# Patient Record
Sex: Male | Born: 1980 | Race: White | Hispanic: No | Marital: Married | Smoking: Never smoker
Health system: Southern US, Community
[De-identification: ages and names within clinical notes are randomized; demographics above are authoritative.]

---
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
## 2003-08-21 ENCOUNTER — Emergency Department (HOSPITAL_COMMUNITY): Admission: EM | Admit: 2003-08-21 | Discharge: 2003-08-21 | Payer: Self-pay | Admitting: Emergency Medicine

## 2006-01-05 ENCOUNTER — Emergency Department (HOSPITAL_COMMUNITY): Admission: EM | Admit: 2006-01-05 | Discharge: 2006-01-05 | Payer: Self-pay | Admitting: Emergency Medicine

## 2006-01-06 ENCOUNTER — Ambulatory Visit (HOSPITAL_COMMUNITY): Admission: RE | Admit: 2006-01-06 | Discharge: 2006-01-06 | Payer: Self-pay | Admitting: Orthopedic Surgery

## 2015-02-21 DIAGNOSIS — Z8249 Family history of ischemic heart disease and other diseases of the circulatory system: Secondary | ICD-10-CM | POA: Insufficient documentation

## 2015-02-21 HISTORY — DX: Family history of ischemic heart disease and other diseases of the circulatory system: Z82.49

## 2019-02-17 ENCOUNTER — Encounter: Payer: Self-pay | Admitting: *Deleted

## 2019-02-17 ENCOUNTER — Ambulatory Visit (INDEPENDENT_AMBULATORY_CARE_PROVIDER_SITE_OTHER): Payer: 59 | Admitting: Cardiology

## 2019-02-17 ENCOUNTER — Ambulatory Visit (INDEPENDENT_AMBULATORY_CARE_PROVIDER_SITE_OTHER): Payer: 59

## 2019-02-17 ENCOUNTER — Other Ambulatory Visit: Payer: Self-pay

## 2019-02-17 VITALS — BP 130/98 | HR 82 | Ht 69.0 in | Wt 210.0 lb

## 2019-02-17 DIAGNOSIS — R002 Palpitations: Secondary | ICD-10-CM

## 2019-02-17 DIAGNOSIS — Z01812 Encounter for preprocedural laboratory examination: Secondary | ICD-10-CM

## 2019-02-17 DIAGNOSIS — R0602 Shortness of breath: Secondary | ICD-10-CM

## 2019-02-17 DIAGNOSIS — I493 Ventricular premature depolarization: Secondary | ICD-10-CM

## 2019-02-17 DIAGNOSIS — R079 Chest pain, unspecified: Secondary | ICD-10-CM | POA: Insufficient documentation

## 2019-02-17 HISTORY — DX: Ventricular premature depolarization: I49.3

## 2019-02-17 HISTORY — DX: Encounter for preprocedural laboratory examination: Z01.812

## 2019-02-17 HISTORY — DX: Chest pain, unspecified: R07.9

## 2019-02-17 HISTORY — DX: Shortness of breath: R06.02

## 2019-02-17 HISTORY — DX: Palpitations: R00.2

## 2019-02-17 MED ORDER — METOPROLOL SUCCINATE ER 25 MG PO TB24: 12.5000 mg | ORAL_TABLET | Freq: Every day | ORAL | 1 refills | Status: DC

## 2019-02-17 MED ORDER — NITROGLYCERIN 0.4 MG SL SUBL: 0.4000 mg | SUBLINGUAL_TABLET | SUBLINGUAL | 3 refills | Status: DC | PRN

## 2019-02-17 NOTE — Progress Notes (Signed)
Cardiology Office Note:    Date:  02/17/2019   ID:  Anthony Rodgers, DOB 1980-12-02, MRN [id number]  PCP:  Hurshel Party, NP  Cardiologist:  No primary care provider on file.  Electrophysiologist:  None   Referring MD: No ref. provider found   Chief Complaint  Patient presents with  . Shortness of Breath  . Chest Pain  . Tachycardia   History of Present Illness:    Anthony Rodgers is a 38 y.o. male with a hx signet.of hyperlipidemia and significant family history of coronary artery disease, presents to be evaluated for a chest pain and palpitations.  Patient tells me that over the last several months he has been experiencing increasing palpitations which she described as a rapid onset of fast heartbeat that lasts for sometimes minutes to hours prior to resolution.  Associated with this is shortness of breath.  He states what is of the more problems over the last several weeks is the fact that he has been experiencing substernal mid chest pain.  He notes that this is sometimes a dull sensation that is associated with tightness around his chest.  He denies any radiation but does admit shortness of breath with these episodes.  He notes that he does not matter what he is doing recently he has been experiencing this while sitting.  He was seen in the ED at [facility] due to rapid heartbeat.  At that time he was told that he was also experiencing PVCs.  His work-up was reported to him to be negative and he was asked to follow-up with cardiology.  Of note he was seen at the [facility] ED on 10/28/2022 chest tightness and dyspnea.  On that day he EKG did show frequent PVCs.  He was told to his cardiac work-up was negative  Past Medical History:  Diagnosis Date  . Family history of cardiomyopathy 02/21/2015    Past Surgical History:  Procedure Laterality Date  . HAND SURGERY Right 2001   Football injury    Current Medications: No outpatient medications have  been marked as taking for the 02/17/19 encounter (Office Visit) with Thomasene Ripple, DO.     Allergies:   Patient has no known allergies.   Social History   Socioeconomic History  . Marital status: Married    Spouse name: Not on file  . Number of children: Not on file  . Years of education: Not on file  . Highest education level: Not on file  Occupational History  . Not on file  Tobacco Use  . Smoking status: Never Smoker  . Smokeless tobacco: Never Used  Substance and Sexual Activity  . Alcohol use: Not Currently  . Drug use: Never  . Sexual activity: Not on file  Other Topics Concern  . Not on file  Social History Narrative  . Not on file   Social Determinants of Health   Financial Resource Strain:   . Difficulty of Paying Living Expenses: Not on file  Food Insecurity:   . Worried About Programme researcher, broadcasting/film/video in the Last Year: Not on file  . Ran Out of Food in the Last Year: Not on file  Transportation Needs:   . Lack of Transportation (Medical): Not on file  . Lack of Transportation (Non-Medical): Not on file  Physical Activity:   . Days of Exercise per Week: Not on file  . Minutes of Exercise per Session: Not on file  Stress:   . Feeling of  Stress : Not on file  Social Connections:   . Frequency of Communication with Friends and Family: Not on file  . Frequency of Social Gatherings with Friends and Family: Not on file  . Attends Religious Services: Not on file  . Active Member of Clubs or Organizations: Not on file  . Attends Archivist Meetings: Not on file  . Marital Status: Not on file     Family History: The patient's family history includes Arrhythmia in his brother and father; Diabetes in his mother; Heart attack in his father; Heart disease in his father and paternal grandmother; Heart failure in his father; Hyperlipidemia in his father and mother; Hypertension in his father and mother.  ROS:   Review of Systems  Constitution: Negative for  decreased appetite, fever and weight gain.  HENT: Negative for congestion, ear discharge, hoarse voice and sore throat.   Eyes: Negative for discharge, redness, vision loss in right eye and visual halos.  Cardiovascular: Negative for chest pain, dyspnea on exertion, leg swelling, orthopnea and palpitations.  Respiratory: Negative for cough, hemoptysis, shortness of breath and snoring.   Endocrine: Negative for heat intolerance and polyphagia.  Hematologic/Lymphatic: Negative for bleeding problem. Does not bruise/bleed easily.  Skin: Negative for flushing, nail changes, rash and suspicious lesions.  Musculoskeletal: Negative for arthritis, joint pain, muscle cramps, myalgias, neck pain and stiffness.  Gastrointestinal: Negative for abdominal pain, bowel incontinence, diarrhea and excessive appetite.  Genitourinary: Negative for decreased libido, genital sores and incomplete emptying.  Neurological: Negative for brief paralysis, focal weakness, headaches and loss of balance.  Psychiatric/Behavioral: Negative for altered mental status, depression and suicidal ideas.  Allergic/Immunologic: Negative for HIV exposure and persistent infections.    EKGs/Labs/Other Studies Reviewed:    The following studies were reviewed today:   EKG:  The ekg ordered today demonstrates Sinus rhythm, heart rate 75 bpm with nonspecific ST changes compared to ECG done on February 15, 2019 at that time he was also in sinus rhythm with occasional PVCs.  Recent Labs: No results found for requested labs within last 8760 hours.  Recent Lipid Panel No results found for: CHOL, TRIG, HDL, CHOLHDL, VLDL, LDLCALC, LDLDIRECT  Physical Exam:    VS:  BP (!) 130/98 (BP Location: Left Arm, Patient Position: Sitting, Cuff Size: Normal)   Pulse 82   Ht 5\' 9"  (1.753 m)   Wt 210 lb (95.3 kg)   SpO2 97%   BMI 31.01 kg/m     Wt Readings from Last 3 Encounters:  02/17/19 210 lb (95.3 kg)     GEN: Well nourished, well  developed in no acute distress HEENT: Normal NECK: No JVD; No carotid bruits LYMPHATICS: No lymphadenopathy CARDIAC: S1S2 noted,RRR, no murmurs, rubs, gallops RESPIRATORY:  Clear to auscultation without rales, wheezing or rhonchi  ABDOMEN: Soft, non-tender, non-distended, +bowel sounds, no guarding. EXTREMITIES: No edema, No cyanosis, no clubbing MUSCULOSKELETAL:  No edema; No deformity  SKIN: Warm and dry NEUROLOGIC:  Alert and oriented x 3, non-focal PSYCHIATRIC:  Normal affect, good insight  ASSESSMENT:    1. Pre-procedure lab exam   2. Chest pain, unspecified type   3. Palpitations    PLAN:    1.  I would like to rule out a cardiovascular etiology of this palpitation, therefore at this time I would like to placed a zio patch for 7 days. In additon a transthoracic echocardiogram will be ordered to assess LV/RV function and any structural abnormalities. Once these testing have been performed amd reviewed  further reccomendations will be made. For now, I do reccomend that the patient goes to the nearest ED if  symptoms recur.  2.  His chest pain is concerning given his family history of significant premature coronary artery disease, he also does have hypertension which I discussed with the patient today as well as he has been treated for hyperlipidemia.  Therefore I am going to pursue an ischemic evaluation in the setting of the symptoms.  He does not have any IV contrast dye allergies.  A coronary CTA will be appropriate in this patient.  Educated patient about the testing and he is agreeable to proceed.  Sublingual nitroglycerin prescription was sent, its protocol and 911 protocol explained and the patient vocalized understanding questions were answered to the patient's satisfaction  3.  Hypertension-I am going to start the patient on Toprol XL 12.5 mg daily which will mainly also help with his palpitations and increasing PVCs.  4.  Hyperlipidemia-continue patient on his current Lipitor  20 mg daily.   The patient is in agreement with the above plan. The patient left the office in stable condition.  The patient will follow up in 3 months or sooner if needed   Medication Adjustments/Labs and Tests Ordered: Current medicines are reviewed at length with the patient today.  Concerns regarding medicines are outlined above.  Orders Placed This Encounter  Procedures  . CT CORONARY FRACTIONAL FLOW RESERVE DATA PREP  . CT CORONARY FRACTIONAL FLOW RESERVE FLUID ANALYSIS  . CT CORONARY MORPH W/CTA COR W/SCORE W/CA W/CM &/OR WO/CM  . Basic Metabolic Panel (BMET)  . LONG TERM MONITOR (3-14 DAYS)  . EKG 12-Lead  . ECHOCARDIOGRAM COMPLETE   Meds ordered this encounter  Medications  . metoprolol succinate (TOPROL XL) 25 MG 24 hr tablet    Sig: Take 0.5 tablets (12.5 mg total) by mouth daily.    Dispense:  45 tablet    Refill:  1  . nitroGLYCERIN (NITROSTAT) 0.4 MG SL tablet    Sig: Place 1 tablet (0.4 mg total) under the tongue every 5 (five) minutes as needed.    Dispense:  30 tablet    Refill:  3    Patient Instructions  Medication Instructions:  Your physician has recommended you make the following change in your medication:   START: Toprol XL(metoprolol succinate) 25 mg Take 1/2 tab daily  Nitroglycerin 0.4 mg sublingual (under your tongue) as needed for chest pain. If experiencing chest pain, stop what you are doing and sit down. Take 1 nitroglycerin and wait 5 minutes. If chest pain continues, take another nitroglycerin and wait 5 minutes. If chest pain does not subside, take 1 more nitroglycerin and dial 911. You make take a total of 3 nitroglycerin in a 15 minute time frame. .    If you need a refill on your cardiac medications before your next appointment, please call your pharmacy*  Lab Work: Your physician recommends that you return for lab work in:   3-7 days prior to CT:BMP  If you have labs (blood work) drawn today and your tests are completely normal,  you will receive your results only by: Marland Kitchen. MyChart Message (if you have MyChart) OR. . A paper copy in the mail If you have any lab test that is abnormal or we need to change your treatment, we will call you to review the results.  Testing/Procedures: Your physician has requested that you have an echocardiogram. Echocardiography is a painless test that uses sound waves to create  images of your heart. It provides your doctor with information about the size and shape of your heart and how well your heart's chambers and valves are working. This procedure takes approximately one hour. There are no restrictions for this procedure.  A zio monitor was placed today. It will remain on for 7 days. You will then return monitor and event diary in provided box. It takes 1-2 weeks for report to be downloaded and returned to [place]. We will call you with the results. If monitor falls off or has orange flashing light, please call Zio for further instructions.   Your physician has requested that you have cardiac CT. Cardiac computed tomography (CT) is a painless test that uses an x-ray machine to take clear, detailed pictures of your heart. For further information please visit [email or web address] Please follow instruction sheet as given.  Your cardiac CT will be scheduled at one of the below locations:   [facility] [address] [place], [place] [address] [phone]   If scheduled at [facility], please arrive at the [facility] main entrance of [facility] 30-45 minutes prior to test start time. Proceed to the [facility] Radiology Department (first floor) to check-in and test prep.    Please follow these instructions carefully (unless otherwise directed):    On the Night Before the Test: . Be sure to Drink plenty of water. . Do not consume any caffeinated/decaffeinated beverages or chocolate 12 hours prior to your test. . Do not take any antihistamines 12 hours prior to  your test.  On the Day of the Test: . Drink plenty of water. Do not drink any water within one hour of the test. . Do not eat any food 4 hours prior to the test. . You may take your regular medications prior to the test.  . Take metoprolol (Toprol) two hours prior to test. . FEMALES- please wear underwire-free bra if available                  -If HR is less than 55 BPM- No Beta Blocker(Toprol XL)                -IF HR is greater than 55 BPM and patient is less than or equal to 75 yrs old Toprol XL 12.5 mg x1.               After the Test: . Drink plenty of water. . After receiving IV contrast, you may experience a mild flushed feeling. This is normal. . On occasion, you may experience a mild rash up to 24 hours after the test. This is not dangerous. If this occurs, you can take Benadryl 25 mg and increase your fluid intake. . If you experience trouble breathing, this can be serious. If it is severe call 911 IMMEDIATELY. If it is mild, please call our office.   Once we have confirmed authorization from your insurance company, we will call you to set up a date and time for your test.   For non-scheduling related questions, please contact the cardiac imaging nurse navigator should you have any questions/concerns: Rockwell Alexandria, RN Navigator Cardiac Imaging [facility] [phone] Office   Follow-Up: At [facility], you and your health needs are our priority.  As part of our continuing mission to provide you with exceptional heart care, we have created designated Provider Care Teams.  These Care Teams include your primary Cardiologist (physician) and Advanced Practice Providers (  APPs -  Physician Assistants and Nurse Practitioners) who all work together to provide you with the care you need, when you need it.  Your next appointment:   3 month(s)  The format for your next appointment:   In Person  Provider:   Thomasene Ripple, DO  Other Instructions       Adopting a Healthy Lifestyle.  Know what a healthy weight is for you (roughly BMI <25) and aim to maintain this   Aim for 7+ servings of fruits and vegetables daily   65-80+ fluid ounces of water or unsweet tea for healthy kidneys   Limit to max 1 drink of alcohol per day; avoid smoking/tobacco   Limit animal fats in diet for cholesterol and heart health - choose grass fed whenever available   Avoid highly processed foods, and foods high in saturated/trans fats   Aim for low stress - take time to unwind and care for your mental health   Aim for 150 min of moderate intensity exercise weekly for heart health, and weights twice weekly for bone health   Aim for 7-9 hours of sleep daily   When it comes to diets, agreement about the perfect plan isnt easy to find, even among the experts. Experts at the [facility] of [employer] developed an idea known as the Healthy Eating Plate. Just imagine a plate divided into logical, healthy portions.   The emphasis is on diet quality:   Load up on vegetables and fruits - one-half of your plate: Aim for color and variety, and remember that potatoes dont count.   Go for whole grains - one-quarter of your plate: Whole wheat, barley, wheat berries, quinoa, oats, brown rice, and foods made with them. If you want pasta, go with whole wheat pasta.   Protein power - one-quarter of your plate: Fish, chicken, beans, and nuts are all healthy, versatile protein sources. Limit red meat.   The diet, however, does go beyond the plate, offering a few other suggestions.   Use healthy plant oils, such as olive, canola, soy, corn, sunflower and peanut. Check the labels, and avoid partially hydrogenated oil, which have unhealthy trans fats.   If youre thirsty, drink water. Coffee and tea are good in moderation, but skip sugary drinks and limit milk and dairy products to one or two daily servings.   The type of carbohydrate in the diet is more important  than the amount. Some sources of carbohydrates, such as vegetables, fruits, whole grains, and beans-are healthier than others.   Finally, stay active  Signed, Thomasene Ripple, DO  02/17/2019 10:46 AM    [facility]

## 2019-02-17 NOTE — Patient Instructions (Signed)
Medication Instructions:  Your physician has recommended you make the following change in your medication:   START: Toprol XL(metoprolol succinate) 25 mg Take 1/2 tab daily  Nitroglycerin 0.4 mg sublingual (under your tongue) as needed for chest pain. If experiencing chest pain, stop what you are doing and sit down. Take 1 nitroglycerin and wait 5 minutes. If chest pain continues, take another nitroglycerin and wait 5 minutes. If chest pain does not subside, take 1 more nitroglycerin and dial 911. You make take a total of 3 nitroglycerin in a 15 minute time frame. .    If you need a refill on your cardiac medications before your next appointment, please call your pharmacy*  Lab Work: Your physician recommends that you return for lab work in:   3-7 days prior to CT:BMP  If you have labs (blood work) drawn today and your tests are completely normal, you will receive your results only by: Marland Kitchen MyChart Message (if you have MyChart) OR. . A paper copy in the mail If you have any lab test that is abnormal or we need to change your treatment, we will call you to review the results.  Testing/Procedures: Your physician has requested that you have an echocardiogram. Echocardiography is a painless test that uses sound waves to create images of your heart. It provides your doctor with information about the size and shape of your heart and how well your heart's chambers and valves are working. This procedure takes approximately one hour. There are no restrictions for this procedure.  A zio monitor was placed today. It will remain on for 7 days. You will then return monitor and event diary in provided box. It takes 1-2 weeks for report to be downloaded and returned to [place]. We will call you with the results. If monitor falls off or has orange flashing light, please call Zio for further instructions.   Your physician has requested that you have cardiac CT. Cardiac computed tomography (CT) is a painless test  that uses an x-ray machine to take clear, detailed pictures of your heart. For further information please visit [email or web address] Please follow instruction sheet as given.  Your cardiac CT will be scheduled at one of the below locations:   [facility] [address] [place], [place] [address] [phone]   If scheduled at [facility], please arrive at the [facility] main entrance of [facility] 30-45 minutes prior to test start time. Proceed to the [facility] Radiology Department (first floor) to check-in and test prep.    Please follow these instructions carefully (unless otherwise directed):    On the Night Before the Test: . Be sure to Drink plenty of water. . Do not consume any caffeinated/decaffeinated beverages or chocolate 12 hours prior to your test. . Do not take any antihistamines 12 hours prior to your test.  On the Day of the Test: . Drink plenty of water. Do not drink any water within one hour of the test. . Do not eat any food 4 hours prior to the test. . You may take your regular medications prior to the test.  . Take metoprolol (Toprol) two hours prior to test. . FEMALES- please wear underwire-free bra if available                  -If HR is less than 55 BPM- No Beta Blocker(Toprol XL)                -IF HR is  greater than 55 BPM and patient is less than or equal to 30 yrs old Toprol XL 12.5 mg x1.               After the Test: . Drink plenty of water. . After receiving IV contrast, you may experience a mild flushed feeling. This is normal. . On occasion, you may experience a mild rash up to 24 hours after the test. This is not dangerous. If this occurs, you can take Benadryl 25 mg and increase your fluid intake. . If you experience trouble breathing, this can be serious. If it is severe call 911 IMMEDIATELY. If it is mild, please call our office.   Once we have confirmed authorization from your insurance company, we will  call you to set up a date and time for your test.   For non-scheduling related questions, please contact the cardiac imaging nurse navigator should you have any questions/concerns: Marchia Bond, RN Navigator Cardiac Imaging [facility] [phone] Office   Follow-Up: At [facility], you and your health needs are our priority.  As part of our continuing mission to provide you with exceptional heart care, we have created designated Provider Care Teams.  These Care Teams include your primary Cardiologist (physician) and Advanced Practice Providers (APPs -  Physician Assistants and Nurse Practitioners) who all work together to provide you with the care you need, when you need it.  Your next appointment:   3 month(s)  The format for your next appointment:   In Person  Provider:   Berniece Salines, DO  Other Instructions

## 2019-02-18 ENCOUNTER — Ambulatory Visit (HOSPITAL_BASED_OUTPATIENT_CLINIC_OR_DEPARTMENT_OTHER)
Admission: RE | Admit: 2019-02-18 | Discharge: 2019-02-18 | Disposition: A | Discharge status: Home / Self Care | Payer: 59 | Source: Ambulatory Visit | Attending: Cardiology | Admitting: Cardiology

## 2019-02-18 DIAGNOSIS — R079 Chest pain, unspecified: Secondary | ICD-10-CM | POA: Insufficient documentation

## 2019-02-18 NOTE — Progress Notes (Signed)
  Echocardiogram 2D Echocardiogram has been performed.    Cardell Peach 02/18/2019, 11:22 AM

## 2019-02-21 ENCOUNTER — Telehealth: Payer: Self-pay | Admitting: *Deleted

## 2019-02-21 NOTE — Telephone Encounter (Signed)
-----   Message from Berniece Salines, DO sent at 02/19/2019  9:42 PM EST ----- Normal echo. Please forward copy to pcp.

## 2019-02-21 NOTE — Telephone Encounter (Signed)
Telephone call to patient. Left message that echo was normal and to call with any questions. 

## 2019-03-01 ENCOUNTER — Emergency Department
Admission: EM | Admit: 2019-03-01 | Discharge: 2019-03-01 | Disposition: A | Discharge status: Home / Self Care | Payer: 59 | Attending: Student in an Organized Health Care Education/Training Program | Admitting: Student in an Organized Health Care Education/Training Program

## 2019-03-01 ENCOUNTER — Emergency Department: Payer: 59

## 2019-03-01 ENCOUNTER — Other Ambulatory Visit: Payer: Self-pay

## 2019-03-01 DIAGNOSIS — R0789 Other chest pain: Secondary | ICD-10-CM | POA: Insufficient documentation

## 2019-03-01 DIAGNOSIS — R002 Palpitations: Secondary | ICD-10-CM | POA: Diagnosis present

## 2019-03-01 DIAGNOSIS — Z20828 Contact with and (suspected) exposure to other viral communicable diseases: Secondary | ICD-10-CM | POA: Diagnosis not present

## 2019-03-01 LAB — CBC
HCT: 41.1 % (ref 39.0–52.0)
Hemoglobin: 14.1 g/dL (ref 13.0–17.0)
MCH: 30 pg (ref 26.0–34.0)
MCHC: 34.3 g/dL (ref 30.0–36.0)
MCV: 87.4 fL (ref 80.0–100.0)
Platelets: 330 10*3/uL (ref 150–400)
RBC: 4.7 MIL/uL (ref 4.22–5.81)
RDW: 11.9 % (ref 11.5–15.5)
WBC: 8.4 10*3/uL (ref 4.0–10.5)
nRBC: 0 % (ref 0.0–0.2)

## 2019-03-01 LAB — BASIC METABOLIC PANEL
Anion gap: 11 (ref 5–15)
BUN: 17 mg/dL (ref 6–20)
CO2: 23 mmol/L (ref 22–32)
Calcium: 9 mg/dL (ref 8.9–10.3)
Chloride: 103 mmol/L (ref 98–111)
Creatinine, Ser: 1.11 mg/dL (ref 0.61–1.24)
GFR calc Af Amer: >60 mL/min (ref >60)
GFR calc non Af Amer: >60 mL/min (ref >60)
Glucose, Bld: 156 mg/dL — ABNORMAL HIGH (ref 70–99)
Potassium: 3.1 mmol/L — ABNORMAL LOW (ref 3.5–5.1)
Sodium: 137 mmol/L (ref 135–145)

## 2019-03-01 LAB — TROPONIN I (HIGH SENSITIVITY)
Troponin I (High Sensitivity): 3 ng/L (ref <18)
Troponin I (High Sensitivity): 7 ng/L (ref <18)

## 2019-03-01 MED ORDER — METOPROLOL TARTRATE 5 MG/5ML IV SOLN: 5.0000 mg | Freq: Once | INTRAVENOUS | Status: DC

## 2019-03-01 MED ORDER — MORPHINE SULFATE (PF) 4 MG/ML IV SOLN
4.0000 mg | INTRAVENOUS | Status: DC | PRN
  Administered 2019-03-01: 4 mg via INTRAVENOUS
  Filled 2019-03-01 (×2): qty 1

## 2019-03-01 MED ORDER — ONDANSETRON HCL 4 MG/2ML IJ SOLN
4.0000 mg | Freq: Once | INTRAMUSCULAR | Status: AC
  Administered 2019-03-01: 4 mg via INTRAVENOUS
  Filled 2019-03-01: qty 2

## 2019-03-01 MED ORDER — SODIUM CHLORIDE 0.9% FLUSH: 3.0000 mL | Freq: Once | INTRAVENOUS | Status: DC

## 2019-03-01 MED ORDER — SODIUM CHLORIDE 0.9 % IV BOLUS
500.0000 mL | Freq: Once | INTRAVENOUS | Status: AC
  Administered 2019-03-01: 500 mL via INTRAVENOUS

## 2019-03-01 MED ORDER — IOHEXOL 350 MG/ML SOLN
100.0000 mL | Freq: Once | INTRAVENOUS | Status: AC | PRN
  Administered 2019-03-01: 100 mL via INTRAVENOUS
  Filled 2019-03-01: qty 100

## 2019-03-01 MED ORDER — METOPROLOL SUCCINATE ER 25 MG PO TB24
12.5000 mg | ORAL_TABLET | Freq: Every day | ORAL | Status: DC
  Administered 2019-03-01: 12.5 mg via ORAL
  Filled 2019-03-01 (×2): qty 0.5

## 2019-03-01 NOTE — ED Notes (Signed)
Pt returned from CT °

## 2019-03-01 NOTE — ED Notes (Signed)
Dr. Robinson at the bedside for pt evaluation  

## 2019-03-01 NOTE — ED Triage Notes (Signed)
Pt states he was recently seen by cardiology. Pt c/o cp and palpitations that started today. Pt reports taking 3 SL Nitro in 25 minutes w/ relief of pain but not palpitations and dizziness.  Pt AOx4, nad noted. Skin warm, pink, dry.

## 2019-03-01 NOTE — ED Notes (Signed)
Pt to CT

## 2019-03-01 NOTE — ED Notes (Signed)
Pt placed on bedside monitor. Tolerated well. Reports pain has improved. Provided for comfort and safety. Will continue to assess.

## 2019-03-01 NOTE — Discharge Instructions (Addendum)
Please contact Dr. Terrial Rhodes or Dr. Donivan Scull office for close follow up.  Return for any additional questions or concerns.

## 2019-03-01 NOTE — ED Triage Notes (Signed)
First NUrse Note:  C/O palpitations x 30 minutes.  States had previous symptoms similar to current and recently wore a Holter monitor and was given NTG.  3 NTG given PTA.

## 2019-03-01 NOTE — ED Provider Notes (Signed)
[facility] Provider Note    First MD Initiated Contact with Patient 03/01/19 1924     (approximate)  I have reviewed the triage vital signs and the nursing notes.   HISTORY  Chief Complaint Chest Pain    HPI [name] is a 38 y.o. male presents to the ER for evaluation of palpitations constant chest discomfort as well as right-sided stress pain and pleuritic discomfort been intermittent for the past several days.  Has follow-up with cardiology for similar symptoms.  Had reassuring echo.  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  Was started on metoprolol.  States today at work he started having recurrent palpitations and discomfort.  Denies any recent fevers.  No abdominal pain no nausea or vomiting.  States he did take nitroglycerin without any significant change in his symptoms.    Past Medical History:  Diagnosis Date  . Family history of cardiomyopathy 02/21/2015   Family History  Problem Relation Age of Onset  . Hyperlipidemia Mother   . Hypertension Mother   . Diabetes Mother   . Arrhythmia Father   . Heart attack Father   . Heart disease Father   . Heart failure Father   . Hyperlipidemia Father   . Hypertension Father   . Heart disease Paternal Grandmother   . Arrhythmia Brother    Past Surgical History:  Procedure Laterality Date  . HAND SURGERY Right 2001   Football injury   Patient Active Problem List   Diagnosis Date Noted  . Shortness of breath 02/17/2019  . PVC (premature ventricular contraction) 02/17/2019  . Palpitations 02/17/2019  . Pre-procedure lab exam 02/17/2019  . Chest pain 02/17/2019  . Family history of cardiomyopathy 02/21/2015      Prior to Admission medications   Medication Sig Start Date End Date Taking? Authorizing Provider  atorvastatin (LIPITOR) 20 MG tablet Take 20 mg by mouth daily. 01/11/19   [provider]  metoprolol succinate (TOPROL XL) 25 MG 24 hr tablet Take  0.5 tablets (12.5 mg total) by mouth daily. 02/17/19   Tobb, Kardie, DO  nitroGLYCERIN (NITROSTAT) 0.4 MG SL tablet Place 1 tablet (0.4 mg total) under the tongue every 5 (five) minutes as needed. 02/17/19 05/18/19  Tobb, Lavona MoundKardie, DO    Allergies Patient has no known allergies.    Social History Social History   Tobacco Use  . Smoking status: Never Smoker  . Smokeless tobacco: Never Used  Substance Use Topics  . Alcohol use: Not Currently  . Drug use: Never    Review of Systems Patient denies headaches, rhinorrhea, blurry vision, numbness, shortness of breath, chest pain, edema, cough, abdominal pain, nausea, vomiting, diarrhea, dysuria, fevers, rashes or hallucinations unless otherwise stated above in HPI. ____________________________________________   PHYSICAL EXAM:  VITAL SIGNS: Vitals:   03/01/19 1945 03/01/19 2045  BP: (!) 156/93 (!) 131/95  Pulse: (!) 124 93  Resp: 18 18  Temp:    SpO2: 99% 99%    Constitutional: Alert and oriented.  Eyes: Conjunctivae are normal.  Head: Atraumatic. Nose: No congestion/rhinnorhea. Mouth/Throat: Mucous membranes are moist.   Neck: No stridor. Painless ROM.  Cardiovascular: Normal rate, regular rhythm. Grossly normal heart sounds.  Good peripheral circulation. Respiratory: Normal respiratory effort.  No retractions. Lungs CTAB. Gastrointestinal: Soft and nontender in all four quadrants. No distention. No abdominal bruits. No CVA tenderness. Genitourinary:  Musculoskeletal: No lower extremity tenderness nor edema.  No joint effusions. Neurologic:  Normal speech and language. No  gross focal neurologic deficits are appreciated. No facial droop Skin:  Skin is warm, dry and intact. No rash noted. Psychiatric: Mood and affect are normal. Speech and behavior are normal.  ____________________________________________   LABS (all labs ordered are listed, but only abnormal results are displayed)  Results for orders placed or performed  during the hospital encounter of 03/01/19 (from the past 24 hour(s))  Basic metabolic panel     Status: Abnormal   Collection Time: 03/01/19  4:43 PM  Result Value Ref Range   Sodium 137 135 - 145 mmol/L   Potassium 3.1 (L) 3.5 - 5.1 mmol/L   Chloride 103 98 - 111 mmol/L   CO2 23 22 - 32 mmol/L   Glucose, Bld 156 (H) 70 - 99 mg/dL   BUN 17 6 - 20 mg/dL   Creatinine, Ser 9.62 0.61 - 1.24 mg/dL   Calcium 9.0 8.9 - 83.6 mg/dL   GFR calc non Af Amer >60 >60 mL/min   GFR calc Af Amer >60 >60 mL/min   Anion gap 11 5 - 15  CBC     Status: None   Collection Time: 03/01/19  4:43 PM  Result Value Ref Range   WBC 8.4 4.0 - 10.5 K/uL   RBC 4.70 4.22 - 5.81 MIL/uL   Hemoglobin 14.1 13.0 - 17.0 g/dL   HCT 62.9 47.6 - 54.6 %   MCV 87.4 80.0 - 100.0 fL   MCH 30.0 26.0 - 34.0 pg   MCHC 34.3 30.0 - 36.0 g/dL   RDW 50.3 54.6 - 56.8 %   Platelets 330 150 - 400 K/uL   nRBC 0.0 0.0 - 0.2 %  Troponin I (High Sensitivity)     Status: None   Collection Time: 03/01/19  4:43 PM  Result Value Ref Range   Troponin I (High Sensitivity) 3 <18 ng/L  Troponin I (High Sensitivity)     Status: None   Collection Time: 03/01/19  6:43 PM  Result Value Ref Range   Troponin I (High Sensitivity) 7 <18 ng/L   ____________________________________________  EKG My review and personal interpretation at Time: 16:38   Indication: chest pain  Rate: 120  Rhythm: sinus Axis: normal Other: normal intervals, nonspecific st abn, no pre-exitation syndrome ____________________________________________  RADIOLOGY  I personally reviewed all radiographic images ordered to evaluate for the above acute complaints and reviewed radiology reports and findings.  These findings were personally discussed with the patient.  Please see medical record for radiology report.  ____________________________________________   PROCEDURES  Procedure(s) performed:  Procedures    Critical Care performed:  no ____________________________________________   INITIAL IMPRESSION / ASSESSMENT AND PLAN / ED COURSE  Pertinent labs & imaging results that were available during my care of the patient were reviewed by me and considered in my medical decision making (see chart for details).   DDX: Dysrhythmia, ACS, PE, dissection, Covid, bronchitis, pneumonia, dehydration  AZIR MUZYKA is a 38 y.o. who presents to the ED with symptoms as described above.  Patient pleasant well-appearing.  He is tachycardic having some discomfort.  Some of his symptoms may be reproducible.  Does not seem consistent with dissection given no pain ripping or tearing through to his back.  Given his right-sided chest discomfort somewhat pleuritic in nature will order CT angiogram to exclude pulmonary embolism.  EKG without any evidence of acute ischemia's initial troponin is normal.  Does have family risk factors for cardiac disease but patient otherwise fairly healthy.  His heart score  is 3.  Will observe in the ER on monitor order serial enzymes give IV fluids pain medication and reassess.  Clinical Course as of Feb 28 2126  Tue Mar 01, 2019  2046 Patient without any pain at this time.  Heart rate down to 96.  No hypoxia.  Somewhat limited CT angiogram but no evidence of segmental or central PE.  Does not seem consistent with PE patient does not have any other risk factors.   [PR]  2114 Patient reassessed.  Remains well-appearing does not seem consistent with ACS given reassuring cardiac enzymes.  Discussed case in consultation with Dr. Rockey Situ of cardiology.  Will coordinate with cardiology for close outpatient follow-up and probable CT coronary.  Have discussed with the patient and available family all diagnostics and treatments performed thus far and all questions were answered to the best of my ability. The patient demonstrates understanding and agreement with plan.    [PR]    Clinical Course User Index [PR] Merlyn Lot, MD    The patient was evaluated in Emergency Department today for the symptoms described in the history of present illness. He/she was evaluated in the context of the global COVID-19 pandemic, which necessitated consideration that the patient might be at risk for infection with the SARS-CoV-2 virus that causes COVID-19. Institutional protocols and algorithms that pertain to the evaluation of patients at risk for COVID-19 are in a state of rapid change based on information released by regulatory bodies including the CDC and federal and state organizations. These policies and algorithms were followed during the patient's care in the ED.  As part of my medical decision making, I reviewed the following data within the [place] notes reviewed and incorporated, Labs reviewed, notes from prior ED visits and [place] Controlled Substance Database   ____________________________________________   FINAL CLINICAL IMPRESSION(S) / ED DIAGNOSES  Final diagnoses:  Palpitations  Atypical chest pain      NEW MEDICATIONS STARTED DURING THIS VISIT:  New Prescriptions   No medications on file     Note:  This document was prepared using Dragon voice recognition software and may include unintentional dictation errors.    Merlyn Lot, MD 03/01/19 2127

## 2019-03-02 ENCOUNTER — Encounter (HOSPITAL_COMMUNITY): Payer: Self-pay

## 2019-03-02 ENCOUNTER — Telehealth: Payer: Self-pay | Admitting: Cardiovascular Disease

## 2019-03-02 ENCOUNTER — Telehealth (HOSPITAL_COMMUNITY): Payer: Self-pay | Admitting: Emergency Medicine

## 2019-03-02 DIAGNOSIS — R079 Chest pain, unspecified: Secondary | ICD-10-CM

## 2019-03-02 LAB — SARS CORONAVIRUS 2 (TAT 6-24 HRS): SARS Coronavirus 2: NEGATIVE

## 2019-03-02 NOTE — Addendum Note (Signed)
Addended by: Shellia Cleverly on: 03/02/2019 09:55 AM   Modules accepted: Orders

## 2019-03-02 NOTE — Telephone Encounter (Signed)
Reaching out to patient to offer assistance regarding upcoming cardiac imaging study; pt verbalizes understanding of appt date/time, parking situation and where to check in, pre-test NPO status and medications ordered, and verified current allergies; name and call back number provided for further questions should they arise Amaani Guilbault RN Navigator Cardiac Imaging [facility] [phone] office [phone] cell 

## 2019-03-02 NOTE — Telephone Encounter (Signed)
Coronary CT was reordered as stat as requested.

## 2019-03-02 NOTE — Telephone Encounter (Signed)
Patient calling this morning to have ct done asap per conversation with [facility] ED provider.    Spoke with Constance Holster at [facility] scheduling and she will speak with precert about expediting but the order needs to be changed to stat or patient will have to be scheduled out into mid January for [place] and February for [place].    See note below .

## 2019-03-02 NOTE — Telephone Encounter (Signed)
Received phone call from the [facility] emergency room, patient presented for chest pain it appears he was seen by cardiology December 10, order placed for cardiac CTA, he was told it might be 6 weeks out to schedule  In the emergency room ruled out, chest CT scan no acute findings discharged from the emergency room with the hope of having the cardiac CTA completed  Are we able to move his scan up to help facilitate his work-up, and prevent an admission to the hospital for recurrent symptoms  Can this scan be done in [place] if there is a delay in [place]?  will CC the team thx TGollan

## 2019-03-03 ENCOUNTER — Ambulatory Visit (HOSPITAL_COMMUNITY)
Admission: RE | Admit: 2019-03-03 | Discharge: 2019-03-03 | Disposition: A | Discharge status: Home / Self Care | Payer: 59 | Source: Ambulatory Visit | Attending: Cardiology | Admitting: Cardiology

## 2019-03-03 ENCOUNTER — Other Ambulatory Visit: Payer: Self-pay

## 2019-03-03 ENCOUNTER — Encounter: Payer: 59 | Admitting: *Deleted

## 2019-03-03 DIAGNOSIS — R079 Chest pain, unspecified: Secondary | ICD-10-CM | POA: Insufficient documentation

## 2019-03-03 DIAGNOSIS — Z006 Encounter for examination for normal comparison and control in clinical research program: Secondary | ICD-10-CM

## 2019-03-03 MED ORDER — IOHEXOL 350 MG/ML SOLN
80.0000 mL | Freq: Once | INTRAVENOUS | Status: AC | PRN
  Administered 2019-03-03: 80 mL via INTRAVENOUS

## 2019-03-03 MED ORDER — NITROGLYCERIN 0.4 MG SL SUBL
0.8000 mg | SUBLINGUAL_TABLET | Freq: Once | SUBLINGUAL | Status: AC
  Administered 2019-03-03: 0.8 mg via SUBLINGUAL

## 2019-03-03 MED ORDER — METOPROLOL TARTRATE 5 MG/5ML IV SOLN
5.0000 mg | INTRAVENOUS | Status: DC | PRN
  Administered 2019-03-03: 5 mg via INTRAVENOUS

## 2019-03-03 MED ORDER — METOPROLOL TARTRATE 5 MG/5ML IV SOLN
INTRAVENOUS | Status: AC
  Filled 2019-03-03: qty 15

## 2019-03-03 MED ORDER — NITROGLYCERIN 0.4 MG SL SUBL
SUBLINGUAL_TABLET | SUBLINGUAL | Status: AC
  Filled 2019-03-03: qty 2

## 2019-03-03 NOTE — Research (Signed)
Mr. Wedel met criteria for the CADFEM trial. The trial was discussed with him including risk versus benefits. He had the opportunity to read the consent and ask questions. The consent was signed at 0830am and a copy of the signed consent was given to him. No study related procedures were performed prior to the consent.

## 2019-03-07 ENCOUNTER — Telehealth: Payer: Self-pay | Admitting: *Deleted

## 2019-03-07 MED ORDER — METOPROLOL SUCCINATE ER 25 MG PO TB24: 25.0000 mg | ORAL_TABLET | Freq: Every day | ORAL | 1 refills | Status: DC

## 2019-03-07 NOTE — Addendum Note (Signed)
Addended by: Particia Nearing B on: 03/07/2019 10:52 AM   Modules accepted: Orders

## 2019-03-07 NOTE — Telephone Encounter (Signed)
Telephone call to patient . Informed to increase toprol xl to 25 mg daily. Pt verbalized understanding.

## 2019-03-07 NOTE — Telephone Encounter (Signed)
Telephone call from patient. States went to [facility] for chest pain and was ruled out for PE and MI. States continuing to have heart racing up to the 150's and wa stold by the ER doctor he could increase his metoprolol succinate from 12.5 mg to 25 mg but needed to check with Dr Harriet Masson 1st. Please advise

## 2019-03-07 NOTE — Telephone Encounter (Signed)
Yes, this will be fine. He can increase the metoprolol succinate to 25 mg daily.

## 2019-04-04 ENCOUNTER — Telehealth: Payer: Self-pay | Admitting: Cardiology

## 2019-04-04 NOTE — Telephone Encounter (Signed)
  Pt states he has been having a rapid HR, intermittent CP and elevated BP. Some days he says he feels fine, but others he feels off. He can tell some days he just feels like his heart is racing and his BP is high. It will eventually come down, but in the days following he will feel miserable. He noticed that in the past 2.5 weeks or so that not much has really changed by taking his medicine.   He recently started noticing a pain in his neck on the Left side. He just wanted to know what to do, because he feels reluctant to wait until March to see Dr. Servando Salina again ?

## 2019-04-04 NOTE — Telephone Encounter (Signed)
That will be fine.  Please have him see me sooner than March.

## 2019-04-04 NOTE — Telephone Encounter (Signed)
Called patient about his message. Patient complaining of rapid HR at times (120), CP that comes and goes, palpitations, SOB with activity (O2 98 %), and left sided neck pain at times. Patient stated he will have days he feels okay, but he has not really improved with the increase in metoprolol. Patient stated he has been to ED several times, and all test are good. Patient is really concerned and he is wondering if he should be seen sooner than March. Will forward to Dr. Tawanna Cooler for advisement.  FYI - Noticed patient's potassium was low in lab work in December. Encouraged patient to increase potassium in his diet.

## 2019-04-05 NOTE — Telephone Encounter (Signed)
Spoke with patient. Made appointment for 04/08/19 at 9:20 AM. Pt satisfied and had no further questions.

## 2019-04-08 ENCOUNTER — Other Ambulatory Visit: Payer: Self-pay

## 2019-04-08 ENCOUNTER — Encounter: Payer: Self-pay | Admitting: Cardiology

## 2019-04-08 ENCOUNTER — Encounter: Payer: Self-pay | Admitting: *Deleted

## 2019-04-08 ENCOUNTER — Ambulatory Visit (INDEPENDENT_AMBULATORY_CARE_PROVIDER_SITE_OTHER): Payer: 59 | Admitting: Cardiology

## 2019-04-08 VITALS — BP 120/88 | HR 87 | Ht 69.0 in | Wt 204.0 lb

## 2019-04-08 DIAGNOSIS — I493 Ventricular premature depolarization: Secondary | ICD-10-CM | POA: Diagnosis not present

## 2019-04-08 DIAGNOSIS — R002 Palpitations: Secondary | ICD-10-CM | POA: Diagnosis not present

## 2019-04-08 DIAGNOSIS — I472 Ventricular tachycardia: Secondary | ICD-10-CM

## 2019-04-08 DIAGNOSIS — I4729 Other ventricular tachycardia: Secondary | ICD-10-CM

## 2019-04-08 MED ORDER — DILTIAZEM HCL ER COATED BEADS 120 MG PO CP24: 120.0000 mg | ORAL_CAPSULE | Freq: Every day | ORAL | 1 refills | Status: DC

## 2019-04-08 MED ORDER — METOPROLOL SUCCINATE ER 25 MG PO TB24: 25.0000 mg | ORAL_TABLET | Freq: Every day | ORAL | 1 refills | Status: DC

## 2019-04-08 NOTE — Progress Notes (Signed)
Cardiology Office Note:    Date:  04/08/2019   ID:  Anthony Rodgers, DOB 1980-03-11, MRN [id number]  PCP:  Lowella Dandy, NP  Cardiologist:  Berniece Salines, DO  Electrophysiologist:  None   Referring MD: Lowella Dandy, NP   Chief Complaint  Patient presents with  . Tachycardia   History of Present Illness:    Anthony Rodgers is a 39 y.o. male with a hx of family history of cardiomyopathy, initially presented on February 17, 2019 to be evaluated for chest pain and palpitations.  Based on his history of the conclusion of the visit I recommended the patient get a transthoracic echocardiogram, where a ZIO monitor and with family history of coronary disease in his chest pain and he undergo coronary CTA.  In the interim the patient has been able to get this testing done.  The results also has been called out to patient during those times.  In the interim the patient called to tell us that he was experiencing worsening tachycardia therefore we increase his Toprol XL from 12.5 mg daily to 25 mg daily.  He also had emergency department visit where he had had fast heart rates and noticed that his heart rate was in the 180s EMS was called and by the time the patient was taken to the ED his heart rate was normal.    Of note during my review of the ED notes-the ED doctor had documented on April 06, 2019 he stated that the patient told him that we asked him to discontinue his metoprolol.  Which is contrary to our previous advised to the patient as we did asked the patient to increase his Toprol since his symptoms were not relieved.  The patient denies this statement and does tell me that he did tell the ED doctor that his Toprol was increased.  He offers no other complaints at this time.  The patient wife was also on the phone during this visit.   Past Medical History:  Diagnosis Date  . Family history of cardiomyopathy 02/21/2015    Past Surgical History:  Procedure Laterality Date  . 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 atorvastatin (LIPITOR) 20 MG tablet Take 20 mg by mouth daily.  . metoprolol succinate (TOPROL XL) 25 MG 24 hr tablet Take 1 tablet (25 mg total) by mouth daily. As needed if SBP(top number) is greater than 110  . nitroGLYCERIN (NITROSTAT) 0.4 MG SL tablet Place 1 tablet (0.4 mg total) under the tongue every 5 (five) minutes as needed.  . [DISCONTINUED] metoprolol succinate (TOPROL XL) 25 MG 24 hr tablet Take 1 tablet (25 mg total) by mouth daily.     Allergies:   Patient has no known allergies.   Social History   Socioeconomic History  . Marital status: Married    Spouse name: Not on file  . Number of children: Not on file  . Years of education: Not on file  . Highest education level: Not on file  Occupational History  . Not on file  Tobacco Use  . Smoking status: Never Smoker  . Smokeless tobacco: Never Used  Substance and Sexual Activity  . Alcohol use: Not Currently  . Drug use: Never  . Sexual activity: Not on file  Other Topics Concern  . Not on file  Social History Narrative  . Not on file   Social Determinants of Health  Financial Resource Strain:   . Difficulty of Paying Living Expenses: Not on file  Food Insecurity:   . Worried About Programme researcher, broadcasting/film/videounning Out of Food in the Last Year: Not on file  . Ran Out of Food in the Last Year: Not on file  Transportation Needs:   . Lack of Transportation (Medical): Not on file  . Lack of Transportation (Non-Medical): Not on file  Physical Activity:   . Days of Exercise per Week: Not on file  . Minutes of Exercise per Session: Not on file  Stress:   . Feeling of Stress : Not on file  Social Connections:   . Frequency of Communication with Friends and Family: Not on file  . Frequency of Social Gatherings with Friends and Family: Not on file  . Attends Religious Services: Not on file  . Active Member of Clubs or Organizations: Not on  file  . Attends BankerClub or Organization Meetings: Not on file  . Marital Status: Not on file     Family History: The patient's family history includes Arrhythmia in his brother and father; Diabetes in his mother; Heart attack in his father; Heart disease in his father and paternal grandmother; Heart failure in his father; Hyperlipidemia in his father and mother; Hypertension in his father and mother.  ROS:   Review of Systems  Constitution: Negative for decreased appetite, fever and weight gain.  HENT: Negative for congestion, ear discharge, hoarse voice and sore throat.   Eyes: Negative for discharge, redness, vision loss in right eye and visual halos.  Cardiovascular: Reports palpitations.  Negative for chest pain, dyspnea on exertion, leg swelling, orthopnea.  Respiratory: Negative for cough, hemoptysis, shortness of breath and snoring.   Endocrine: Negative for heat intolerance and polyphagia.  Hematologic/Lymphatic: Negative for bleeding problem. Does not bruise/bleed easily.  Skin: Negative for flushing, nail changes, rash and suspicious lesions.  Musculoskeletal: Negative for arthritis, joint pain, muscle cramps, myalgias, neck pain and stiffness.  Gastrointestinal: Negative for abdominal pain, bowel incontinence, diarrhea and excessive appetite.  Genitourinary: Negative for decreased libido, genital sores and incomplete emptying.  Neurological: Negative for brief paralysis, focal weakness, headaches and loss of balance.  Psychiatric/Behavioral: Negative for altered mental status, depression and suicidal ideas.  Allergic/Immunologic: Negative for HIV exposure and persistent infections.    EKGs/Labs/Other Studies Reviewed:    The following studies were reviewed today:   EKG:  The ekg ordered today demonstrates sinus rhythm, heart rate 83 bpm with arrhythmia.  Coronary CTA IMPRESSION: 1. Coronary calcium score of 0. This was 0 percentile for age and sex matched control.  2.  Normal coronary origin with right dominance.  3. No evidence of CAD; CADRADS-0.  TTE impression: 02/18/2019 1. Left ventricular ejection fraction, by visual estimation, is 60 to  65%. The left ventricle has normal function. There is no left ventricular  hypertrophy.  2. The left ventricle has no regional wall motion abnormalities.  3. Global right ventricle has normal systolic function.The right  ventricular size is normal. No increase in right ventricular wall  thickness.  4. Left atrial size was normal.  5. Right atrial size was normal.  6. The mitral valve is normal in structure. No evidence of mitral valve  regurgitation. No evidence of mitral stenosis.  7. The tricuspid valve is normal in structure. Tricuspid valve  regurgitation is not demonstrated.  8. The aortic valve is normal in structure. Aortic valve regurgitation is  not visualized. No evidence of aortic valve sclerosis or stenosis.  9.  The pulmonic valve was normal in structure. Pulmonic valve  regurgitation is not visualized.  10. Normal pulmonary artery systolic pressure.   ZIO monitor The patient wore the monitor for 7 days 7 hours starting 02/17/2019. Indication: Palpitations  The minimum heart rate was 47 bpm, maximum heart rate was 179 bpm, and average heart rate was 73  bpm. Predominant underlying rhythm was Sinus Rhythm.  1 run of Ventricular Tachycardia occurred lasting 8 beats with a maximum rate of 179 bpm (average 157 bpm).   Premature atrial complexes were rare (<1.0%).  Premature Ventricular complexes were rare (<1.0%). Ventricular Bigeminy and Trigeminy were present.  No pauses, No AV block and no atrial fibrillation present.  1 patient triggered event associated with ventricular ectopy. Diary note was associated with sinus rhythm.  Conclusion: This study is remarkable for an 8 beat run of nonsustained ventricular tachycardia.   Recent Labs: 03/01/2019: BUN 17; Creatinine, Ser  1.11; Hemoglobin 14.1; Platelets 330; Potassium 3.1; Sodium 137 Blood work on April 05, 2018: CBC: WBC 6.3, hemoglobin 13.5, hematocrit 39.7, platelet 305 Chemistry: Sodium 140, potassium 3.6, chloride 103, bicarb 31, BUN 23, creatinine 1.2, glucose 111 Recent Lipid Panel No results found for: CHOL, TRIG, HDL, CHOLHDL, VLDL, LDLCALC, LDLDIRECT  Physical Exam:    VS:  BP 120/88 (BP Location: Right Arm, Patient Position: Sitting, Cuff Size: Normal)   Pulse 87   Ht 5\' 9"  (1.753 m)   Wt 204 lb (92.5 kg)   SpO2 98%   BMI 30.13 kg/m     Wt Readings from Last 3 Encounters:  04/08/19 204 lb (92.5 kg)  03/01/19 212 lb (96.2 kg)  02/17/19 210 lb (95.3 kg)     GEN: Well nourished, well developed in no acute distress HEENT: Normal NECK: No JVD; No carotid bruits LYMPHATICS: No lymphadenopathy CARDIAC: S1S2 noted,RRR, no murmurs, rubs, gallops RESPIRATORY:  Clear to auscultation without rales, wheezing or rhonchi  ABDOMEN: Soft, non-tender, non-distended, +bowel sounds, no guarding. EXTREMITIES: No edema, No cyanosis, no clubbing MUSCULOSKELETAL:  No deformity  SKIN: Warm and dry NEUROLOGIC:  Alert and oriented x 3, non-focal PSYCHIATRIC:  Normal affect, good insight  ASSESSMENT:    1. PVC (premature ventricular contraction)   2. NSVT (nonsustained ventricular tachycardia) (HCC)   3. Palpitations    PLAN:     He is still experiencing tachycardia/palpitations on his Toprol-XL 25 mg daily.  I am going to start Cardizem 120 mg daily on this patient.  He will take his Toprol-XL now on as-needed basis.  He was instructed to take his blood pressure prior to taking his medication as long as his systolic blood pressures greater than 110 he can take half a pill of his per XL for 10 minutes prior to continuing activities.  His CCTA showed no evidence of coronary artery disease.  I discussed this with the patient.  I discussed with him if this does not help and he continues to experience  palpitations without resolution on the current medication regimen.  We will uptitrate his AV nodal blocking agents.  At that time I would refer him to EP for loop recorder to understand more of his arrhythmia.  The patient has requested a light duty work note as he tells me sometimes he does get dizzy when he experienced palpitations at work.  This note has been provided for the patient. The patient is in agreement with the above plan. The patient left the office in stable condition.  The patient will follow up in  1 month or sooner if needed.   Medication Adjustments/Labs and Tests Ordered: Current medicines are reviewed at length with the patient today.  Concerns regarding medicines are outlined above.  Orders Placed This Encounter  Procedures  . EKG 12-Lead   Meds ordered this encounter  Medications  . diltiazem (CARDIZEM CD) 120 MG 24 hr capsule    Sig: Take 1 capsule (120 mg total) by mouth daily.    Dispense:  90 capsule    Refill:  1  . metoprolol succinate (TOPROL XL) 25 MG 24 hr tablet    Sig: Take 1 tablet (25 mg total) by mouth daily. As needed if SBP(top number) is greater than 110    Dispense:  90 tablet    Refill:  1    Patient Instructions  Medication Instructions:  Your physician has recommended you make the following change in your medication:   START: Cardizem CD (diltiazem) 120 mg Take 1 tab daily  CHANGE; Toprol XL (metoprolol succinate) 12.5 mg as needed if SBP (top number ) is greater than 110.  *If you need a refill on your cardiac medications before your next appointment, please call your pharmacy*  Lab Work: None If you have labs (blood work) drawn today and your tests are completely normal, you will receive your results only by: Marland Kitchen MyChart Message (if you have MyChart) OR . A paper copy in the mail If you have any lab test that is abnormal or we need to change your treatment, we will call you to review the  results.  Testing/Procedures: None  Follow-Up: At [facility], you and your health needs are our priority.  As part of our continuing mission to provide you with exceptional heart care, we have created designated Provider Care Teams.  These Care Teams include your primary Cardiologist (physician) and Advanced Practice Providers (APPs -  Physician Assistants and Nurse Practitioners) who all work together to provide you with the care you need, when you need it.  Your next appointment:   1 month(s)  The format for your next appointment:   In Person  Provider:   Thomasene Ripple, DO  Other Instructions     Adopting a Healthy Lifestyle.  Know what a healthy weight is for you (roughly BMI <25) and aim to maintain this   Aim for 7+ servings of fruits and vegetables daily   65-80+ fluid ounces of water or unsweet tea for healthy kidneys   Limit to max 1 drink of alcohol per day; avoid smoking/tobacco   Limit animal fats in diet for cholesterol and heart health - choose grass fed whenever available   Avoid highly processed foods, and foods high in saturated/trans fats   Aim for low stress - take time to unwind and care for your mental health   Aim for 150 min of moderate intensity exercise weekly for heart health, and weights twice weekly for bone health   Aim for 7-9 hours of sleep daily   When it comes to diets, agreement about the perfect plan isnt easy to find, even among the experts. Experts at the [facility] [employer] developed an idea known as the Healthy Eating Plate. Just imagine a plate divided into logical, healthy portions.   The emphasis is on diet quality:   Load up on vegetables and fruits - one-half of your plate: Aim for color and variety, and remember that potatoes dont count.   Go for whole grains - one-quarter of your plate: Whole wheat, barley,  wheat berries, quinoa, oats, brown rice, and foods made with them. If you want pasta, go with whole  wheat pasta.   Protein power - one-quarter of your plate: Fish, chicken, beans, and nuts are all healthy, versatile protein sources. Limit red meat.   The diet, however, does go beyond the plate, offering a few other suggestions.   Use healthy plant oils, such as olive, canola, soy, corn, sunflower and peanut. Check the labels, and avoid partially hydrogenated oil, which have unhealthy trans fats.   If youre thirsty, drink water. Coffee and tea are good in moderation, but skip sugary drinks and limit milk and dairy products to one or two daily servings.   The type of carbohydrate in the diet is more important than the amount. Some sources of carbohydrates, such as vegetables, fruits, whole grains, and beans-are healthier than others.   Finally, stay active  Signed, Thomasene Ripple, DO  04/08/2019 12:20 PM    [facility]

## 2019-04-08 NOTE — Patient Instructions (Signed)
Medication Instructions:  Your physician has recommended you make the following change in your medication:   START: Cardizem CD (diltiazem) 120 mg Take 1 tab daily  CHANGE; Toprol XL (metoprolol succinate) 12.5 mg as needed if SBP (top number ) is greater than 110.  *If you need a refill on your cardiac medications before your next appointment, please call your pharmacy*  Lab Work: None If you have labs (blood work) drawn today and your tests are completely normal, you will receive your results only by: Marland Kitchen MyChart Message (if you have MyChart) OR . A paper copy in the mail If you have any lab test that is abnormal or we need to change your treatment, we will call you to review the results.  Testing/Procedures: None  Follow-Up: At [facility], you and your health needs are our priority.  As part of our continuing mission to provide you with exceptional heart care, we have created designated Provider Care Teams.  These Care Teams include your primary Cardiologist (physician) and Advanced Practice Providers (APPs -  Physician Assistants and Nurse Practitioners) who all work together to provide you with the care you need, when you need it.  Your next appointment:   1 month(s)  The format for your next appointment:   In Person  Provider:   Thomasene Ripple, DO  Other Instructions

## 2019-04-25 ENCOUNTER — Telehealth: Payer: Self-pay | Admitting: Cardiology

## 2019-04-25 NOTE — Telephone Encounter (Signed)
Spoke with pt wife, she reports the patient has had 2 episodes of heart racing in the last 2 weeks. One on Thursday last week and he took 1/2 of the metoprolol. The episode lasted about 30 min. He had another episode today and he took another 1/2 tablet. She reports he has not felt right since Thursday, he is very tired and is having to sit down and take naps during the day which is not his normal. He has a follow up 05-18-19 with dr Servando Salina and they are wondering if he needs to be seen sooner. Will forward to dr tobb to review and advise.

## 2019-04-25 NOTE — Telephone Encounter (Signed)
Pt c/o medication issue:  1. Name of Medication: Diltiazem  2. How are you currently taking this medication (dosage and times per day)? 1 time a day  3. Are you having a reaction (difficulty breathing--STAT)? no  4. What is your medication issue? Very tired, nausea, heart racing, chest hurting

## 2019-04-25 NOTE — Telephone Encounter (Signed)
Spoke with pt wife, Follow up scheduled  

## 2019-04-25 NOTE — Telephone Encounter (Signed)
Please add him in sooner if there is availability.  Thank you

## 2019-04-26 ENCOUNTER — Other Ambulatory Visit: Payer: Self-pay

## 2019-04-26 ENCOUNTER — Ambulatory Visit (INDEPENDENT_AMBULATORY_CARE_PROVIDER_SITE_OTHER): Payer: 59 | Admitting: Cardiology

## 2019-04-26 ENCOUNTER — Encounter: Payer: Self-pay | Admitting: Cardiology

## 2019-04-26 VITALS — BP 120/80 | HR 81 | Ht 69.0 in | Wt 200.0 lb

## 2019-04-26 DIAGNOSIS — I493 Ventricular premature depolarization: Secondary | ICD-10-CM | POA: Diagnosis not present

## 2019-04-26 DIAGNOSIS — R002 Palpitations: Secondary | ICD-10-CM | POA: Diagnosis not present

## 2019-04-26 DIAGNOSIS — I4729 Other ventricular tachycardia: Secondary | ICD-10-CM

## 2019-04-26 DIAGNOSIS — I472 Ventricular tachycardia: Secondary | ICD-10-CM | POA: Diagnosis not present

## 2019-04-26 MED ORDER — METOPROLOL SUCCINATE ER 25 MG PO TB24: 12.5000 mg | ORAL_TABLET | Freq: Every day | ORAL | 1 refills | Status: DC

## 2019-04-26 NOTE — Progress Notes (Signed)
Cardiology Office Note:    Date:  04/26/2019   ID:  Rosine Beat, DOB 11-08-1980, MRN [id number]  PCP:  Lowella Dandy, NP  Cardiologist:  Berniece Salines, DO  Electrophysiologist:  None   Referring MD: Lowella Dandy, NP   Chief Complaint  Patient presents with  . Follow-up    History of Present Illness:    Anthony Rodgers is a 39 y.o. male with a hx of family history of cardiomyopathy, NSVT seen on ZIO monitor, along with symptomatic ventricular ectopy.  Patient initially was placed on Toprol-XL 12.5 mg daily but this dose did not help as patient had recurrent ED visit therefore I increased it to 25 mg.  He was on the 25 mg daily of Toprol-XL the patient reported that he was still experiencing significant palpitations.  I did see him on April 08, 2019 at that time he stopped the beta-blocker and placed him on Cardizem 120 mg daily.  Today he had some release for the last 2 weeks but few days ago he started again to have significant palpitations.  I had instructed him to take a 25 mg pill Toprol-XL if he has these abrupt spells which he did.  He notes some relief but tells me that during this time his blood pressure is significantly elevated soon after his episode after taking his prn toprol xl  his blood pressure drops to lower 90s.    Today he has had no episodes but he tells me that he is feeling significant chest tightness is concerned as this is nothing to.  Bili 2 weeks.   Past Medical History:  Diagnosis Date  . Family history of cardiomyopathy 02/21/2015    Past Surgical History:  Procedure Laterality Date  . HAND SURGERY Right 2001   Football injury    Current Medications: Current Meds  Medication Sig  . atorvastatin (LIPITOR) 20 MG tablet Take 20 mg by mouth daily.  Marland Kitchen diltiazem (CARDIZEM CD) 120 MG 24 hr capsule Take 1 capsule (120 mg total) by mouth daily.  . nitroGLYCERIN (NITROSTAT) 0.4 MG SL tablet Place 1 tablet (0.4 mg total) under the tongue every 5 (five)  minutes as needed.  . [DISCONTINUED] metoprolol succinate (TOPROL XL) 25 MG 24 hr tablet Take 1 tablet (25 mg total) by mouth daily. As needed if SBP(top number) is greater than 110     Allergies:   Patient has no known allergies.   Social History   Socioeconomic History  . Marital status: Married    Spouse name: Not on file  . Number of children: Not on file  . Years of education: Not on file  . Highest education level: Not on file  Occupational History  . Not on file  Tobacco Use  . Smoking status: Never Smoker  . Smokeless tobacco: Never Used  Substance and Sexual Activity  . Alcohol use: Not Currently  . Drug use: Never  . Sexual activity: Not on file  Other Topics Concern  . Not on file  Social History Narrative  . Not on file   Social Determinants of Health   Financial Resource Strain:   . Difficulty of Paying Living Expenses: Not on file  Food Insecurity:   . Worried About Charity fundraiser in the Last Year: Not on file  . Ran Out of Food in the Last Year: Not on file  Transportation Needs:   . Lack of Transportation (Medical): Not on file  . Lack of Transportation (Non-Medical):  Not on file  Physical Activity:   . Days of Exercise per Week: Not on file  . Minutes of Exercise per Session: Not on file  Stress:   . Feeling of Stress : Not on file  Social Connections:   . Frequency of Communication with Friends and Family: Not on file  . Frequency of Social Gatherings with Friends and Family: Not on file  . Attends Religious Services: Not on file  . Active Member of Clubs or Organizations: Not on file  . Attends Banker Meetings: Not on file  . Marital Status: Not on file     Family History: The patient's family history includes Arrhythmia in his brother and father; Diabetes in his mother; Heart attack in his father; Heart disease in his father and paternal grandmother; Heart failure in his father; Hyperlipidemia in his father and mother;  Hypertension in his father and mother.  ROS:   Review of Systems  Constitution: Negative for decreased appetite, fever and weight gain.  HENT: Negative for congestion, ear discharge, hoarse voice and sore throat.   Eyes: Negative for discharge, redness, vision loss in right eye and visual halos.  Cardiovascular: Negative for chest pain, dyspnea on exertion, leg swelling, orthopnea and palpitations.  Respiratory: Negative for cough, hemoptysis, shortness of breath and snoring.   Endocrine: Negative for heat intolerance and polyphagia.  Hematologic/Lymphatic: Negative for bleeding problem. Does not bruise/bleed easily.  Skin: Negative for flushing, nail changes, rash and suspicious lesions.  Musculoskeletal: Negative for arthritis, joint pain, muscle cramps, myalgias, neck pain and stiffness.  Gastrointestinal: Negative for abdominal pain, bowel incontinence, diarrhea and excessive appetite.  Genitourinary: Negative for decreased libido, genital sores and incomplete emptying.  Neurological: Negative for brief paralysis, focal weakness, headaches and loss of balance.  Psychiatric/Behavioral: Negative for altered mental status, depression and suicidal ideas.  Allergic/Immunologic: Negative for HIV exposure and persistent infections.    EKGs/Labs/Other Studies Reviewed:    The following studies were reviewed today:   EKG:  The ekg ordered today demonstrates   ZIO monitor The patient wore the monitor for7days 7hours starting 02/17/2019. Indication: Palpitations  The minimum heart rate was 47bpm, maximum heart rate was 179bpm, and average heart rate was73bpm. Predominant underlying rhythm was Sinus Rhythm.  1 run of Ventricular Tachycardia occurred lasting 8 beats with a maximum rate of 179 bpm (average 157 bpm).   Premature atrial complexes were rare (<1.0%).  Premature Ventricular complexeswere rare (<1.0%). Ventricular Bigeminy and Trigeminy were present.  No pauses,  No AV block and no atrial fibrillation present.  1patient triggered eventassociated with ventricular ectopy. Diary note was associated with sinus rhythm.  Conclusion: This study isremarkable for an 8 beat run of nonsustained ventricular tachycardia.  Recent Labs: 03/01/2019: BUN 17; Creatinine, Ser 1.11; Hemoglobin 14.1; Platelets 330; Potassium 3.1; Sodium 137  Recent Lipid Panel No results found for: CHOL, TRIG, HDL, CHOLHDL, VLDL, LDLCALC, LDLDIRECT  Physical Exam:    VS:  BP 120/80 (BP Location: Right Arm, Patient Position: Sitting, Cuff Size: Normal)   Pulse 81   Ht 5\' 9"  (1.753 m)   Wt 200 lb (90.7 kg)   SpO2 98%   BMI 29.53 kg/m     Wt Readings from Last 3 Encounters:  04/26/19 200 lb (90.7 kg)  04/08/19 204 lb (92.5 kg)  03/01/19 212 lb (96.2 kg)     GEN: Well nourished, well developed in no acute distress HEENT: Normal NECK: No JVD; No carotid bruits LYMPHATICS: No lymphadenopathy CARDIAC: S1S2  noted,RRR, no murmurs, rubs, gallops RESPIRATORY:  Clear to auscultation without rales, wheezing or rhonchi  ABDOMEN: Soft, non-tender, non-distended, +bowel sounds, no guarding. EXTREMITIES: No edema, No cyanosis, no clubbing MUSCULOSKELETAL:  No deformity  SKIN: Warm and dry NEUROLOGIC:  Alert and oriented x 3, non-focal PSYCHIATRIC:  Normal affect, good insight  ASSESSMENT:    1. NSVT (nonsustained ventricular tachycardia) (HCC)   2. PVC (premature ventricular contraction)   3. Palpitations    PLAN:     I am going to keep the patient on his home 120 mg daily as he has had some improvement with this but in the meantime add metoprolol succinate 12.5 mg at nighttime.  Given episodes of headaches and is going on blood pressure during his episodes going to get serum metanephrine.  If needed we will pursue a CT of the abdomen adrenal masses and possibility pheochromocytoma.  Coronary CTA was normal therefore his chest pain is not in the setting of coronary  disease.  He may need a cardiac MRI to assess for any infiltrative disease however there is no suggestion of infiltrative disease on his echocardiogram.   The patient will refer him to EP for further evaluation of his  Nonsustained ventricular tachycardia.  The patient is in agreement with the above plan. The patient left the office in stable condition.  The patient will follow up in 2 weeks or sooner if needed.   Medication Adjustments/Labs and Tests Ordered: Current medicines are reviewed at length with the patient today.  Concerns regarding medicines are outlined above.  Orders Placed This Encounter  Procedures  . Metanephrines, plasma  . Ambulatory referral to Cardiac Electrophysiology   Meds ordered this encounter  Medications  . metoprolol succinate (TOPROL XL) 25 MG 24 hr tablet    Sig: Take 0.5 tablets (12.5 mg total) by mouth daily.    Dispense:  45 tablet    Refill:  1    Patient Instructions  Medication Instructions:  Your physician has recommended you make the following change in your medication:   START: Toprol XL 25 mg Take 1/2 tab(12.5 mg) daily at night  *If you need a refill on your cardiac medications before your next appointment, please call your pharmacy*  Lab Work: Your physician recommends that you return for lab work in: TODAY Metanephrine level,   If you have labs (blood work) drawn today and your tests are completely normal, you will receive your results only by: Marland Kitchen MyChart Message (if you have MyChart) OR . A paper copy in the mail If you have any lab test that is abnormal or we need to change your treatment, we will call you to review the results.  Testing/Procedures: None   Follow-Up: At [facility], you and your health needs are our priority.  As part of our continuing mission to provide you with exceptional heart care, we have created designated Provider Care Teams.  These Care Teams include your primary Cardiologist (physician) and  Advanced Practice Providers (APPs -  Physician Assistants and Nurse Practitioners) who all work together to provide you with the care you need, when you need it.  Your next appointment:   2 week(s)  The format for your next appointment:   In Person  Provider:   Thomasene Ripple, DO  Other Instructions YOU ARE BEING REFERRED TO DR. Ladona Ridgel FOR YOUR FAST HEART RATE. THEY WILL CONTACT YOU WITH APPOINTMENT DATE AND TIME     Adopting a Healthy Lifestyle.  Know what a healthy weight  is for you (roughly BMI <25) and aim to maintain this   Aim for 7+ servings of fruits and vegetables daily   65-80+ fluid ounces of water or unsweet tea for healthy kidneys   Limit to max 1 drink of alcohol per day; avoid smoking/tobacco   Limit animal fats in diet for cholesterol and heart health - choose grass fed whenever available   Avoid highly processed foods, and foods high in saturated/trans fats   Aim for low stress - take time to unwind and care for your mental health   Aim for 150 min of moderate intensity exercise weekly for heart health, and weights twice weekly for bone health   Aim for 7-9 hours of sleep daily   When it comes to diets, agreement about the perfect plan isnt easy to find, even among the experts. Experts at the [facility] [employer] developed an idea known as the Healthy Eating Plate. Just imagine a plate divided into logical, healthy portions.   The emphasis is on diet quality:   Load up on vegetables and fruits - one-half of your plate: Aim for color and variety, and remember that potatoes dont count.   Go for whole grains - one-quarter of your plate: Whole wheat, barley, wheat berries, quinoa, oats, brown rice, and foods made with them. If you want pasta, go with whole wheat pasta.   Protein power - one-quarter of your plate: Fish, chicken, beans, and nuts are all healthy, versatile protein sources. Limit red meat.   The diet, however, does go beyond the  plate, offering a few other suggestions.   Use healthy plant oils, such as olive, canola, soy, corn, sunflower and peanut. Check the labels, and avoid partially hydrogenated oil, which have unhealthy trans fats.   If youre thirsty, drink water. Coffee and tea are good in moderation, but skip sugary drinks and limit milk and dairy products to one or two daily servings.   The type of carbohydrate in the diet is more important than the amount. Some sources of carbohydrates, such as vegetables, fruits, whole grains, and beans-are healthier than others.   Finally, stay active  Signed, Thomasene Ripple, DO  04/26/2019 12:33 PM    [facility]

## 2019-04-26 NOTE — Patient Instructions (Addendum)
Medication Instructions:  Your physician has recommended you make the following change in your medication:   START: Toprol XL 25 mg Take 1/2 tab(12.5 mg) daily at night  *If you need a refill on your cardiac medications before your next appointment, please call your pharmacy*  Lab Work: Your physician recommends that you return for lab work in: TODAY Metanephrine level,   If you have labs (blood work) drawn today and your tests are completely normal, you will receive your results only by: Marland Kitchen MyChart Message (if you have MyChart) OR . A paper copy in the mail If you have any lab test that is abnormal or we need to change your treatment, we will call you to review the results.  Testing/Procedures: None   Follow-Up: At [facility], you and your health needs are our priority.  As part of our continuing mission to provide you with exceptional heart care, we have created designated Provider Care Teams.  These Care Teams include your primary Cardiologist (physician) and Advanced Practice Providers (APPs -  Physician Assistants and Nurse Practitioners) who all work together to provide you with the care you need, when you need it.  Your next appointment:   2 week(s)  The format for your next appointment:   In Person  Provider:   Thomasene Ripple, DO  Other Instructions YOU ARE BEING REFERRED TO DR. Ladona Ridgel FOR YOUR FAST HEART RATE. THEY WILL CONTACT YOU WITH APPOINTMENT DATE AND TIME

## 2019-05-02 LAB — METANEPHRINES, PLASMA
Metanephrine, Free: 27.7 pg/mL (ref 0.0–88.0)
Normetanephrine, Free: 59.4 pg/mL (ref 0.0–110.1)

## 2019-05-03 ENCOUNTER — Encounter: Payer: Self-pay | Admitting: Internal Medicine

## 2019-05-03 ENCOUNTER — Other Ambulatory Visit: Payer: Self-pay

## 2019-05-03 ENCOUNTER — Ambulatory Visit (INDEPENDENT_AMBULATORY_CARE_PROVIDER_SITE_OTHER): Payer: 59 | Admitting: Internal Medicine

## 2019-05-03 VITALS — BP 126/86 | HR 73 | Ht 69.0 in | Wt 198.0 lb

## 2019-05-03 DIAGNOSIS — R002 Palpitations: Secondary | ICD-10-CM | POA: Diagnosis not present

## 2019-05-03 MED ORDER — FLECAINIDE ACETATE 150 MG PO TABS: 75.0000 mg | ORAL_TABLET | Freq: Two times a day (BID) | ORAL | 3 refills | Status: DC

## 2019-05-03 MED ORDER — METOPROLOL SUCCINATE ER 25 MG PO TB24: 25.0000 mg | ORAL_TABLET | Freq: Every day | ORAL | 3 refills | Status: DC

## 2019-05-03 NOTE — Patient Instructions (Addendum)
Medication Instructions:  Your physician has recommended you make the following change in your medication:   1.  Stop taking cardizem (keep this medication, just stop taking it for now)  2.  Increase your Toprol XL 25 mg-  Take one tablet by mouth daily  3.  Start taking flecainide 150 mg --Take 1/2 tablet (75 mg) by mouth TWICE a day  Labwork: None ordered.  Testing/Procedures: None ordered.  Follow-Up: Your physician wants you to follow-up in: 4 weeks with Dr. Ladona Ridgel.    May 30, 2019 at 10:15 am   Any Other Special Instructions Will Be Listed Below (If Applicable).  If you need a refill on your cardiac medications before your next appointment, please call your pharmacy.   Flecainide tablets What is this medicine? FLECAINIDE (FLEK a nide) is an antiarrhythmic drug. This medicine is used to prevent irregular heart rhythm. It can also slow down fast heartbeats called tachycardia. This medicine may be used for other purposes; ask your health care provider or pharmacist if you have questions. COMMON BRAND NAME(S): Tambocor What should I tell my health care provider before I take this medicine? They need to know if you have any of these conditions:  abnormal levels of potassium in the blood  heart disease including heart rhythm and heart rate problems  kidney or liver disease  recent heart attack  an unusual or allergic reaction to flecainide, local anesthetics, other medicines, foods, dyes, or preservatives  pregnant or trying to get pregnant  breast-feeding How should I use this medicine? Take this medicine by mouth with a glass of water. Follow the directions on the prescription label. You can take this medicine with or without food. Take your doses at regular intervals. Do not take your medicine more often than directed. Do not stop taking this medicine suddenly. This may cause serious, heart-related side effects. If your doctor wants you to stop the medicine, the  dose may be slowly lowered over time to avoid any side effects. Talk to your pediatrician regarding the use of this medicine in children. While this drug may be prescribed for children as young as 1 year of age for selected conditions, precautions do apply. Overdosage: If you think you have taken too much of this medicine contact a poison control center or emergency room at once. NOTE: This medicine is only for you. Do not share this medicine with others. What if I miss a dose? If you miss a dose, take it as soon as you can. If it is almost time for your next dose, take only that dose. Do not take double or extra doses. What may interact with this medicine? Do not take this medicine with any of the following medications:  amoxapine  arsenic trioxide  certain antibiotics like clarithromycin, erythromycin, gatifloxacin, gemifloxacin, levofloxacin, moxifloxacin, sparfloxacin, or troleandomycin  certain antidepressants called tricyclic antidepressants like amitriptyline, imipramine, or nortriptyline  certain medicines to control heart rhythm like disopyramide, encainide, moricizine, procainamide, propafenone, and quinidine  cisapride  delavirdine  droperidol  haloperidol  hawthorn  imatinib  levomethadyl  maprotiline  medicines for malaria like chloroquine and halofantrine  pentamidine  phenothiazines like chlorpromazine, mesoridazine, prochlorperazine, thioridazine  pimozide  quinine  ranolazine  ritonavir  sertindole This medicine may also interact with the following medications:  cimetidine  dofetilide  medicines for angina or high blood pressure  medicines to control heart rhythm like amiodarone and digoxin  ziprasidone This list may not describe all possible interactions. Give your health care provider  a list of all the medicines, herbs, non-prescription drugs, or dietary supplements you use. Also tell them if you smoke, drink alcohol, or use illegal  drugs. Some items may interact with your medicine. What should I watch for while using this medicine? Visit your doctor or health care professional for regular checks on your progress. Because your condition and the use of this medicine carries some risk, it is a good idea to carry an identification card, necklace or bracelet with details of your condition, medications and doctor or health care professional. Check your blood pressure and pulse rate regularly. Ask your health care professional what your blood pressure and pulse rate should be, and when you should contact him or her. Your doctor or health care professional also may schedule regular blood tests and electrocardiograms to check your progress. You may get drowsy or dizzy. Do not drive, use machinery, or do anything that needs mental alertness until you know how this medicine affects you. Do not stand or sit up quickly, especially if you are an older patient. This reduces the risk of dizzy or fainting spells. Alcohol can make you more dizzy, increase flushing and rapid heartbeats. Avoid alcoholic drinks. What side effects may I notice from receiving this medicine? Side effects that you should report to your doctor or health care professional as soon as possible:  chest pain, continued irregular heartbeats  difficulty breathing  swelling of the legs or feet  trembling, shaking  unusually weak or tired Side effects that usually do not require medical attention (report to your doctor or health care professional if they continue or are bothersome):  blurred vision  constipation  headache  nausea, vomiting  stomach pain This list may not describe all possible side effects. Call your doctor for medical advice about side effects. You may report side effects to FDA at 1-800-FDA-1088. Where should I keep my medicine? Keep out of the reach of children. Store at room temperature between 15 and 30 degrees C (59 and 86 degrees F). Protect  from light. Keep container tightly closed. Throw away any unused medicine after the expiration date. NOTE: This sheet is a summary. It may not cover all possible information. If you have questions about this medicine, talk to your doctor, pharmacist, or health care provider.  2020 Elsevier/Gold Standard (2018-02-15 11:41:38)

## 2019-05-03 NOTE — Progress Notes (Signed)
HPI Anthony Rodgers is referred today by Dr. Carlena Sax for evaluation of palpitations, NSVT and PVC's. He is a very pleasant 39 yo man with HTN who developed palpitations several months ago. His workup has been notable for preserved LV function, no evidence of CAD by CT scan, and occaisional PVC's and NSVT seen on cardiac monitor. He denies syncope. He has aunts and a father who died of heart disease in there 26's.  No Known Allergies   Current Outpatient Medications  Medication Sig Dispense Refill  . atorvastatin (LIPITOR) 20 MG tablet Take 20 mg by mouth daily.    Marland Kitchen diltiazem (CARDIZEM CD) 120 MG 24 hr capsule Take 1 capsule (120 mg total) by mouth daily. 90 capsule 1  . metoprolol succinate (TOPROL XL) 25 MG 24 hr tablet Take 0.5 tablets (12.5 mg total) by mouth daily. 45 tablet 1  . nitroGLYCERIN (NITROSTAT) 0.4 MG SL tablet Place 1 tablet (0.4 mg total) under the tongue every 5 (five) minutes as needed. 30 tablet 3   No current facility-administered medications for this visit.     Past Medical History:  Diagnosis Date  . Family history of cardiomyopathy 02/21/2015    ROS:   All systems reviewed and negative except as noted in the HPI.   Past Surgical History:  Procedure Laterality Date  . HAND SURGERY Right 2001   Football injury     Family History  Problem Relation Age of Onset  . Hyperlipidemia Mother   . Hypertension Mother   . Diabetes Mother   . Arrhythmia Father   . Heart attack Father   . Heart disease Father   . Heart failure Father   . Hyperlipidemia Father   . Hypertension Father   . Heart disease Paternal Grandmother   . Arrhythmia Brother      Social History   Socioeconomic History  . Marital status: Married    Spouse name: Not on file  . Number of children: Not on file  . Years of education: Not on file  . Highest education level: Not on file  Occupational History  . Not on file  Tobacco Use  . Smoking status: Never Smoker  . Smokeless  tobacco: Never Used  Substance and Sexual Activity  . Alcohol use: Not Currently  . Drug use: Never  . Sexual activity: Not on file  Other Topics Concern  . Not on file  Social History Narrative  . Not on file   Social Determinants of Health   Financial Resource Strain:   . Difficulty of Paying Living Expenses: Not on file  Food Insecurity:   . Worried About Programme researcher, broadcasting/film/video in the Last Year: Not on file  . Ran Out of Food in the Last Year: Not on file  Transportation Needs:   . Lack of Transportation (Medical): Not on file  . Lack of Transportation (Non-Medical): Not on file  Physical Activity:   . Days of Exercise per Week: Not on file  . Minutes of Exercise per Session: Not on file  Stress:   . Feeling of Stress : Not on file  Social Connections:   . Frequency of Communication with Friends and Family: Not on file  . Frequency of Social Gatherings with Friends and Family: Not on file  . Attends Religious Services: Not on file  . Active Member of Clubs or Organizations: Not on file  . Attends Banker Meetings: Not on file  . Marital Status: Not on  file  Intimate Partner Violence:   . Fear of Current or Ex-Partner: Not on file  . Emotionally Abused: Not on file  . Physically Abused: Not on file  . Sexually Abused: Not on file     BP 126/86   Pulse 73   Ht 5\' 9"  (1.753 m)   Wt 198 lb (89.8 kg)   SpO2 99%   BMI 29.24 kg/m   Physical Exam:  Well appearing NAD HEENT: Unremarkable Neck:  No JVD, no thyromegally Lymphatics:  No adenopathy Back:  No CVA tenderness Lungs:  Clear with no wheezes HEART:  Regular rate rhythm, no murmurs, no rubs, no clicks Abd:  soft, positive bowel sounds, no organomegally, no rebound, no guarding Ext:  2 plus pulses, no edema, no cyanosis, no clubbing Skin:  No rashes no nodules Neuro:  CN II through XII intact, motor grossly intact  EKG - reviewed - NSR  Assess/Plan: 1. PVC's and NSVT - he is symptomatic but  seems to be improved on cardizem. I discussed additional eval with either/and a cardiac MRI to look for focal myocarditis or placing an ILR. We also discussed additional AA drug therapy as he is still symptomatic. I would like to try him on flecainide and metoprolol. I will see him back to reassess his symptoms. He will need a 12 lead ECG. If his symptoms are much improved, I will have him undergo exercise testing on flecainide.  2. HTN - his bp is controlled. We will follow.  3. Chest pain - his cardiac CT was low risk. I would not recommend additional eval for now.   Mikle Bosworth.D.

## 2019-05-17 ENCOUNTER — Ambulatory Visit: Payer: 59 | Admitting: Cardiology

## 2019-05-18 ENCOUNTER — Encounter: Payer: Self-pay | Admitting: Cardiology

## 2019-05-18 ENCOUNTER — Ambulatory Visit (INDEPENDENT_AMBULATORY_CARE_PROVIDER_SITE_OTHER): Payer: 59 | Admitting: Cardiology

## 2019-05-18 ENCOUNTER — Other Ambulatory Visit: Payer: Self-pay

## 2019-05-18 VITALS — BP 122/88 | HR 85 | Ht 69.0 in | Wt 201.4 lb

## 2019-05-18 DIAGNOSIS — Z79899 Other long term (current) drug therapy: Secondary | ICD-10-CM

## 2019-05-18 DIAGNOSIS — I472 Ventricular tachycardia: Secondary | ICD-10-CM

## 2019-05-18 DIAGNOSIS — Z5181 Encounter for therapeutic drug level monitoring: Secondary | ICD-10-CM

## 2019-05-18 DIAGNOSIS — R002 Palpitations: Secondary | ICD-10-CM | POA: Diagnosis not present

## 2019-05-18 DIAGNOSIS — I4729 Other ventricular tachycardia: Secondary | ICD-10-CM

## 2019-05-18 DIAGNOSIS — I493 Ventricular premature depolarization: Secondary | ICD-10-CM | POA: Diagnosis not present

## 2019-05-18 DIAGNOSIS — I10 Essential (primary) hypertension: Secondary | ICD-10-CM

## 2019-05-18 HISTORY — DX: Ventricular tachycardia: I47.2

## 2019-05-18 HISTORY — DX: Other long term (current) drug therapy: Z79.899

## 2019-05-18 HISTORY — DX: Encounter for therapeutic drug level monitoring: Z51.81

## 2019-05-18 HISTORY — DX: Other ventricular tachycardia: I47.29

## 2019-05-18 NOTE — Progress Notes (Signed)
Cardiology Office Note:    Date:  05/18/2019   ID:  Anthony BaumgartenStephen B Rodgers, DOB 09/01/1980, MRN [id number]  PCP:  Hurshel PartyMoon, Anthony A, NP  Cardiologist:  Anthony RippleKardie Ilay Capshaw, DO  Electrophysiologist:  None   Referring MD: No ref. provider found   Follow up for palpitations and chest pain  History of Present Illness:    Anthony Rodgers is a 39 y.o. male with a hx of PVC , asymptomatic NSVT on zio monitor, hypertension presents for a follow up visit.   I did see patient on 02/17/2019 - at that ime I started him on Toprol-XL 12.5 mg daily for his hypertension and palpitations but this dose did not help as patient had recurrent ED visit therefore I increased it to 25 mg.  He was on the 25 mg daily of Toprol-XL the patient reported that he was still experiencing significant palpitations.    I did see him on April 08, 2019 at that time he stopped the beta-blocker and placed him on Cardizem 120 mg daily.  Today he had some release for the last 2 weeks but few days ago he started again to have significant palpitations.  I had instructed him to take a 25 mg pill Toprol-XL if he has these abrupt spells which he did.  He notes some relief but tells me that during this time his blood pressure is significantly elevated soon after his episode after taking his prn toprol xl  his blood pressure drops to lower 90s.On 04/26/2019 I saw the patient and placed him on daily beta blocker in addition to his Cardizem. At that time, I recommended that the patient see EP.   In the interim he was able to see Anthony Rodgers and during that visit, Cardizem was stopped. He was started on Flecainide 75 mg twice daily.   Today he tells he that he has had some relief since starting flecainide. He admits to one subtle episode but did not last long. Overall he is happy with his progress.    Past Medical History:  Diagnosis Date  . Family history of cardiomyopathy 02/21/2015    Past Surgical History:  Procedure Laterality Date  . 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 atorvastatin (LIPITOR) 20 MG tablet Take 20 mg by mouth daily.  . flecainide (TAMBOCOR) 150 MG tablet Take 0.5 tablets (75 mg total) by mouth 2 (two) times daily.  . metoprolol succinate (TOPROL XL) 25 MG 24 hr tablet Take 1 tablet (25 mg total) by mouth daily.  . nitroGLYCERIN (NITROSTAT) 0.4 MG SL tablet Place 1 tablet (0.4 mg total) under the tongue every 5 (five) minutes as needed.     Allergies:   Patient has no known allergies.   Social History   Socioeconomic History  . Marital status: Married    Spouse name: Not on file  . Number of children: Not on file  . Years of education: Not on file  . Highest education level: Not on file  Occupational History  . Not on file  Tobacco Use  . Smoking status: Never Smoker  . Smokeless tobacco: Never Used  Substance and Sexual Activity  . Alcohol use: Not Currently  . Drug use: Never  . Sexual activity: Not on file  Other Topics Concern  . Not on file  Social History Narrative  . Not on file   Social Determinants of Health   Financial Resource Strain:   .  Difficulty of Paying Living Expenses: Not on file  Food Insecurity:   . Worried About Programme researcher, broadcasting/film/video in the Last Year: Not on file  . Ran Out of Food in the Last Year: Not on file  Transportation Needs:   . Lack of Transportation (Medical): Not on file  . Lack of Transportation (Non-Medical): Not on file  Physical Activity:   . Days of Exercise per Week: Not on file  . Minutes of Exercise per Session: Not on file  Stress:   . Feeling of Stress : Not on file  Social Connections:   . Frequency of Communication with Friends and Family: Not on file  . Frequency of Social Gatherings with Friends and Family: Not on file  . Attends Religious Services: Not on file  . Active Member of Clubs or Organizations: Not on file  . Attends Banker Meetings: Not on file  .  Marital Status: Not on file     Family History: The patient's family history includes Arrhythmia in his brother and father; Diabetes in his mother; Heart attack in his father and paternal grandmother; Heart disease in his father and paternal grandmother; Heart failure in his father; Hyperlipidemia in his father and mother; Hypertension in his father and mother.  ROS:   Review of Systems  Constitution: Negative for decreased appetite, fever and weight gain.  HENT: Negative for congestion, ear discharge, hoarse voice and sore throat.   Eyes: Negative for discharge, redness, vision loss in right eye and visual halos.  Cardiovascular: Negative for chest pain, dyspnea on exertion, leg swelling, orthopnea and palpitations.  Respiratory: Negative for cough, hemoptysis, shortness of breath and snoring.   Endocrine: Negative for heat intolerance and polyphagia.  Hematologic/Lymphatic: Negative for bleeding problem. Does not bruise/bleed easily.  Skin: Negative for flushing, nail changes, rash and suspicious lesions.  Musculoskeletal: Negative for arthritis, joint pain, muscle cramps, myalgias, neck pain and stiffness.  Gastrointestinal: Negative for abdominal pain, bowel incontinence, diarrhea and excessive appetite.  Genitourinary: Negative for decreased libido, genital sores and incomplete emptying.  Neurological: Negative for brief paralysis, focal weakness, headaches and loss of balance.  Psychiatric/Behavioral: Negative for altered mental status, depression and suicidal ideas.  Allergic/Immunologic: Negative for HIV exposure and persistent infections.    EKGs/Labs/Other Studies Reviewed:    The following studies were reviewed today:   EKG:  None today  CCTA IMPRESSION: 03/03/2019 1. Coronary calcium score of 0. This was 0 percentile for age and sex matched control.  2. Normal coronary origin with right dominance.  3. No evidence of CAD; CADRADS-0.  Olga Millers   Zio  monitor  The patient wore the monitor for 7 days 7 hours starting 02/17/2019. Indication: Palpitations  The minimum heart rate was 47 bpm, maximum heart rate was 179 bpm, and average heart rate was 73  bpm. Predominant underlying rhythm was Sinus Rhythm.  1 run of Ventricular Tachycardia occurred lasting 8 beats with a maximum rate of 179 bpm (average 157 bpm).   Premature atrial complexes were rare (<1.0%).  Premature Ventricular complexes were rare (<1.0%). Ventricular Bigeminy and Trigeminy were present.  No pauses, No AV block and no atrial fibrillation present.  1 patient triggered event associated with ventricular ectopy. Diary note was associated with sinus rhythm.  Conclusion: This study is remarkable for an 8 beat run of nonsustained ventricular tachycardia.  TTE IMPRESSIONS 02/18/2019 1. Left ventricular ejection fraction, by visual estimation, is 60 to  65%. The left ventricle has normal function.  There is no left ventricular  hypertrophy.  2. The left ventricle has no regional wall motion abnormalities.  3. Global right ventricle has normal systolic function.The right  ventricular size is normal. No increase in right ventricular wall  thickness.  4. Left atrial size was normal.  5. Right atrial size was normal.  6. The mitral valve is normal in structure. No evidence of mitral valve  regurgitation. No evidence of mitral stenosis.  7. The tricuspid valve is normal in structure. Tricuspid valve  regurgitation is not demonstrated.  8. The aortic valve is normal in structure. Aortic valve regurgitation is  not visualized. No evidence of aortic valve sclerosis or stenosis.  9. The pulmonic valve was normal in structure. Pulmonic valve  regurgitation is not visualized.  10. Normal pulmonary artery systolic pressure.   Recent Labs: 03/01/2019: BUN 17; Creatinine, Ser 1.11; Hemoglobin 14.1; Platelets 330; Potassium 3.1; Sodium 137  Recent Lipid Panel No  results found for: CHOL, TRIG, HDL, CHOLHDL, VLDL, LDLCALC, LDLDIRECT  Physical Exam:    VS:  BP 122/88 (BP Location: Right Arm, Patient Position: Sitting)   Pulse 85   Ht 5\' 9"  (1.753 m)   Wt 201 lb 6.4 oz (91.4 kg)   SpO2 98%   BMI 29.74 kg/m     Wt Readings from Last 3 Encounters:  05/18/19 201 lb 6.4 oz (91.4 kg)  05/03/19 198 lb (89.8 kg)  04/26/19 200 lb (90.7 kg)     GEN: Well nourished, well developed in no acute distress HEENT: Normal NECK: No JVD; No carotid bruits LYMPHATICS: No lymphadenopathy CARDIAC: S1S2 noted,RRR, no murmurs, rubs, gallops RESPIRATORY:  Clear to auscultation without rales, wheezing or rhonchi  ABDOMEN: Soft, non-tender, non-distended, +bowel sounds, no guarding. EXTREMITIES: No edema, No cyanosis, no clubbing MUSCULOSKELETAL:  No deformity  SKIN: Warm and dry NEUROLOGIC:  Alert and oriented x 3, non-focal PSYCHIATRIC:  Normal affect, good insight  ASSESSMENT:    1. Palpitations   2. PVC (premature ventricular contraction)   3. NSVT (nonsustained ventricular tachycardia) ([place])   4. Encounter for monitoring flecainide therapy   5. Essential hypertension    PLAN:    Palpitations/PVC/NSVT - The patient is happy with his improvement since starting flecainide. We will continue this medication along with his beta blocker. He plans to see EP later this month. Will get flecainide level prior to his EP visit.   HTN - blood pressure acceptable.  Chest pain - coronary CTA low risk.   The patient is in agreement with the above plan. The patient left the office in stable condition.  The patient will follow up in 3 months or sooner if needed.   Medication Adjustments/Labs and Tests Ordered: Current medicines are reviewed at length with the patient today.  Concerns regarding medicines are outlined above.  Orders Placed This Encounter  Procedures  . Flecainide level   No orders of the defined types were placed in this encounter.   Patient  Instructions  Medication Instructions:  Your physician recommends that you continue on your current medications as directed. Please refer to the Current Medication list given to you today.  *If you need a refill on your cardiac medications before your next appointment, please call your pharmacy*   Lab Work: Your physician recommends that you return for lab work 2 days before next appointment: Flecainide level   If you have labs (blood work) drawn today and your tests are completely normal, you will receive your results only by: Marland Kitchen MyChart  Message (if you have MyChart) OR . A paper copy in the mail If you have any lab test that is abnormal or we need to change your treatment, we will call you to review the results.   Testing/Procedures: None.    Follow-Up: At [facility], you and your health needs are our priority.  As part of our continuing mission to provide you with exceptional heart care, we have created designated Provider Care Teams.  These Care Teams include your primary Cardiologist (physician) and Advanced Practice Providers (APPs -  Physician Assistants and Nurse Practitioners) who all work together to provide you with the care you need, when you need it.  We recommend signing up for the patient portal called "MyChart".  Sign up information is provided on this After Visit Summary.  MyChart is used to connect with patients for Virtual Visits (Telemedicine).  Patients are able to view lab/test results, encounter notes, upcoming appointments, etc.  Non-urgent messages can be sent to your provider as well.   To learn more about what you can do with MyChart, go to [email or web address].    Your next appointment:   3 month(s)  The format for your next appointment:   In Person  Provider:   Thomasene Ripple, DO   Other Instructions       Adopting a Healthy Lifestyle.  Know what a healthy weight is for you (roughly BMI <25) and aim to maintain this   Aim for 7+ servings  of fruits and vegetables daily   65-80+ fluid ounces of water or unsweet tea for healthy kidneys   Limit to max 1 drink of alcohol per day; avoid smoking/tobacco   Limit animal fats in diet for cholesterol and heart health - choose grass fed whenever available   Avoid highly processed foods, and foods high in saturated/trans fats   Aim for low stress - take time to unwind and care for your mental health   Aim for 150 min of moderate intensity exercise weekly for heart health, and weights twice weekly for bone health   Aim for 7-9 hours of sleep daily   When it comes to diets, agreement about the perfect plan isnt easy to find, even among the experts. Experts at the [facility] [employer] developed an idea known as the Healthy Eating Plate. Just imagine a plate divided into logical, healthy portions.   The emphasis is on diet quality:   Load up on vegetables and fruits - one-half of your plate: Aim for color and variety, and remember that potatoes dont count.   Go for whole grains - one-quarter of your plate: Whole wheat, barley, wheat berries, quinoa, oats, brown rice, and foods made with them. If you want pasta, go with whole wheat pasta.   Protein power - one-quarter of your plate: Fish, chicken, beans, and nuts are all healthy, versatile protein sources. Limit red meat.   The diet, however, does go beyond the plate, offering a few other suggestions.   Use healthy plant oils, such as olive, canola, soy, corn, sunflower and peanut. Check the labels, and avoid partially hydrogenated oil, which have unhealthy trans fats.   If youre thirsty, drink water. Coffee and tea are good in moderation, but skip sugary drinks and limit milk and dairy products to one or two daily servings.   The type of carbohydrate in the diet is more important than the amount. Some sources of carbohydrates, such as vegetables, fruits, whole grains, and beans-are healthier than others.  Finally,  stay active  Signed, Anthony Ripple, DO  05/18/2019 9:15 PM    [facility]

## 2019-05-18 NOTE — Patient Instructions (Signed)
Medication Instructions:  Your physician recommends that you continue on your current medications as directed. Please refer to the Current Medication list given to you today.  *If you need a refill on your cardiac medications before your next appointment, please call your pharmacy*   Lab Work: Your physician recommends that you return for lab work 2 days before next appointment: Flecainide level   If you have labs (blood work) drawn today and your tests are completely normal, you will receive your results only by: Marland Kitchen MyChart Message (if you have MyChart) OR . A paper copy in the mail If you have any lab test that is abnormal or we need to change your treatment, we will call you to review the results.   Testing/Procedures: None.    Follow-Up: At [facility], you and your health needs are our priority.  As part of our continuing mission to provide you with exceptional heart care, we have created designated Provider Care Teams.  These Care Teams include your primary Cardiologist (physician) and Advanced Practice Providers (APPs -  Physician Assistants and Nurse Practitioners) who all work together to provide you with the care you need, when you need it.  We recommend signing up for the patient portal called "MyChart".  Sign up information is provided on this After Visit Summary.  MyChart is used to connect with patients for Virtual Visits (Telemedicine).  Patients are able to view lab/test results, encounter notes, upcoming appointments, etc.  Non-urgent messages can be sent to your provider as well.   To learn more about what you can do with MyChart, go to [email or web address].    Your next appointment:   3 month(s)  The format for your next appointment:   In Person  Provider:   Thomasene Ripple, DO   Other Instructions

## 2019-05-28 LAB — FLECAINIDE LEVEL: Flecainide: 0.28 ug/mL (ref 0.20–1.00)

## 2019-05-30 ENCOUNTER — Encounter: Payer: Self-pay | Admitting: Internal Medicine

## 2019-05-30 ENCOUNTER — Ambulatory Visit (INDEPENDENT_AMBULATORY_CARE_PROVIDER_SITE_OTHER): Payer: 59 | Admitting: Internal Medicine

## 2019-05-30 ENCOUNTER — Other Ambulatory Visit: Payer: Self-pay

## 2019-05-30 VITALS — BP 120/78 | HR 72 | Resp 15 | Ht 69.0 in | Wt 201.0 lb

## 2019-05-30 DIAGNOSIS — R002 Palpitations: Secondary | ICD-10-CM | POA: Diagnosis not present

## 2019-05-30 DIAGNOSIS — I472 Ventricular tachycardia: Secondary | ICD-10-CM | POA: Diagnosis not present

## 2019-05-30 DIAGNOSIS — I493 Ventricular premature depolarization: Secondary | ICD-10-CM

## 2019-05-30 DIAGNOSIS — I4729 Other ventricular tachycardia: Secondary | ICD-10-CM

## 2019-05-30 NOTE — Patient Instructions (Signed)

## 2019-05-30 NOTE — Progress Notes (Signed)
HPI [name] is referred today by Dr. Carlena Sax for evaluation of palpitations, NSVT and PVC's. He is a very pleasant 39 yo man with HTN who developed palpitations several months ago. His workup has been notable for preserved LV function, no evidence of CAD by CT scan, and occaisional PVC's and NSVT seen on cardiac monitor. When I saw him back in February we started flecainide 75 bid and his symptoms have resolved. He remains active but has been limited at work. He had a trough flecainide level of 0.28.  No Known Allergies   Current Outpatient Medications  Medication Sig Dispense Refill  . atorvastatin (LIPITOR) 20 MG tablet Take 20 mg by mouth daily.    . flecainide (TAMBOCOR) 150 MG tablet Take 0.5 tablets (75 mg total) by mouth 2 (two) times daily. 90 tablet 3  . metoprolol succinate (TOPROL XL) 25 MG 24 hr tablet Take 1 tablet (25 mg total) by mouth daily. 90 tablet 3  . nitroGLYCERIN (NITROSTAT) 0.4 MG SL tablet Place 1 tablet (0.4 mg total) under the tongue every 5 (five) minutes as needed. 30 tablet 3   No current facility-administered medications for this visit.     Past Medical History:  Diagnosis Date  . Family history of cardiomyopathy 02/21/2015    ROS:   All systems reviewed and negative except as noted in the HPI.   Past Surgical History:  Procedure Laterality Date  . HAND SURGERY Right 2001   Football injury     Family History  Problem Relation Age of Onset  . Hyperlipidemia Mother   . Hypertension Mother   . Diabetes Mother   . Arrhythmia Father   . Heart attack Father   . Heart disease Father   . Heart failure Father   . Hyperlipidemia Father   . Hypertension Father   . Heart disease Paternal Grandmother   . Heart attack Paternal Grandmother   . Arrhythmia Brother      Social History   Socioeconomic History  . Marital status: Married    Spouse name: Not on file  . Number of children: Not on file  . Years of education: Not on file  .  Highest education level: Not on file  Occupational History  . Not on file  Tobacco Use  . Smoking status: Never Smoker  . Smokeless tobacco: Never Used  Substance and Sexual Activity  . Alcohol use: Not Currently  . Drug use: Never  . Sexual activity: Not on file  Other Topics Concern  . Not on file  Social History Narrative  . Not on file   Social Determinants of Health   Financial Resource Strain:   . Difficulty of Paying Living Expenses:   Food Insecurity:   . Worried About Programme researcher, broadcasting/film/video in the Last Year:   . Barista in the Last Year:   Transportation Needs:   . Freight forwarder (Medical):   Marland Kitchen Lack of Transportation (Non-Medical):   Physical Activity:   . Days of Exercise per Week:   . Minutes of Exercise per Session:   Stress:   . Feeling of Stress :   Social Connections:   . Frequency of Communication with Friends and Family:   . Frequency of Social Gatherings with Friends and Family:   . Attends Religious Services:   . Active Member of Clubs or Organizations:   . Attends Banker Meetings:   Marland Kitchen Marital Status:   Intimate Programme researcher, broadcasting/film/video  Violence:   . Fear of Current or Ex-Partner:   . Emotionally Abused:   Marland Kitchen Physically Abused:   . Sexually Abused:      BP 120/78   Pulse 72   Resp 15   Ht 5\' 9"  (1.753 m)   Wt 201 lb (91.2 kg)   SpO2 99%   BMI 29.68 kg/m   Physical Exam:  Well appearing NAD HEENT: Unremarkable Neck:  No JVD, no thyromegally Lymphatics:  No adenopathy Back:  No CVA tenderness Lungs:  Clear with no wheezes HEART:  Regular rate rhythm, no murmurs, no rubs, no clicks Abd:  soft, positive bowel sounds, no organomegally, no rebound, no guarding Ext:  2 plus pulses, no edema, no cyanosis, no clubbing Skin:  No rashes no nodules Neuro:  CN II through XII intact, motor grossly intact  EKG - nsr  Assess/Plan: 1. PVC's/NSVT - he is much improved since his initiation of flecainide and level is low normal. His ECG  looks good. I think that the likelihood of pro arrhythmia on flecainide is very low. I will see him back in 6 months. He can return to work without limit. If he requires more flecainide, I will have him undergo exercise testing. 2. Non-cardiac chest pain - he is anxious about this and I have tried to reassure him. 3. HTN - his bp today is well controlled.   [email or web address] [email or web address].D.

## 2019-06-22 ENCOUNTER — Telehealth: Payer: Self-pay | Admitting: Internal Medicine

## 2019-06-22 DIAGNOSIS — Z79899 Other long term (current) drug therapy: Secondary | ICD-10-CM

## 2019-06-22 DIAGNOSIS — Z5181 Encounter for therapeutic drug level monitoring: Secondary | ICD-10-CM

## 2019-06-22 DIAGNOSIS — I472 Ventricular tachycardia: Secondary | ICD-10-CM

## 2019-06-22 DIAGNOSIS — I493 Ventricular premature depolarization: Secondary | ICD-10-CM

## 2019-06-22 DIAGNOSIS — I4729 Other ventricular tachycardia: Secondary | ICD-10-CM

## 2019-06-22 MED ORDER — FLECAINIDE ACETATE 100 MG PO TABS: 100.0000 mg | ORAL_TABLET | Freq: Two times a day (BID) | ORAL | 3 refills | Status: DC

## 2019-06-22 NOTE — Telephone Encounter (Signed)
Per Dr. Ladona Ridgel-  Increase flecainide to 100 mg twice a day  Schedule exercise treadmill test in 2 weeks.  Orders placed and sent messaging to scheduling.  Will notify Pt of final details via MyChart.  (already discussed Pt symptoms and as reported by wife)

## 2019-06-22 NOTE — Telephone Encounter (Signed)
STAT if HR is under 50 or over 120 (normal HR is 60-100 beats per minute)  1) What is your heart rate? 70  2) Do you have a log of your heart rate readings (document readings)? Gets as high as 150  3) Do you have any other symptoms? Started about a week - a week and a half ago. Chest and left arm are starting to bother him again. She states this is typical. She states he is feeling tired but is not feeling as bad today. She states his HR didn't go up until last night. She would like you to call her husband.

## 2019-07-08 ENCOUNTER — Other Ambulatory Visit (HOSPITAL_COMMUNITY)
Admission: RE | Admit: 2019-07-08 | Discharge: 2019-07-08 | Disposition: A | Discharge status: Home / Self Care | Payer: 59 | Source: Ambulatory Visit | Attending: Internal Medicine | Admitting: Internal Medicine

## 2019-07-08 DIAGNOSIS — Z20822 Contact with and (suspected) exposure to covid-19: Secondary | ICD-10-CM | POA: Diagnosis not present

## 2019-07-08 DIAGNOSIS — Z01812 Encounter for preprocedural laboratory examination: Secondary | ICD-10-CM | POA: Diagnosis not present

## 2019-07-08 LAB — SARS CORONAVIRUS 2 (TAT 6-24 HRS): SARS Coronavirus 2: NEGATIVE

## 2019-07-12 ENCOUNTER — Ambulatory Visit: Payer: 59

## 2019-07-12 ENCOUNTER — Other Ambulatory Visit: Payer: Self-pay

## 2019-07-12 DIAGNOSIS — I4729 Other ventricular tachycardia: Secondary | ICD-10-CM

## 2019-07-12 DIAGNOSIS — I493 Ventricular premature depolarization: Secondary | ICD-10-CM

## 2019-07-12 DIAGNOSIS — Z5181 Encounter for therapeutic drug level monitoring: Secondary | ICD-10-CM

## 2019-07-12 DIAGNOSIS — Z79899 Other long term (current) drug therapy: Secondary | ICD-10-CM

## 2019-07-12 DIAGNOSIS — I472 Ventricular tachycardia: Secondary | ICD-10-CM

## 2019-07-12 LAB — EXERCISE TOLERANCE TEST
Estimated workload: 13.7 METS
Exercise duration (min): 12 min
Exercise duration (sec): 0 s
MPHR: 183 {beats}/min
Peak HR: 176 {beats}/min
Percent HR: 96 %
RPE: 17
Rest HR: 74 {beats}/min

## 2019-08-18 ENCOUNTER — Ambulatory Visit (INDEPENDENT_AMBULATORY_CARE_PROVIDER_SITE_OTHER): Payer: 59 | Admitting: Cardiology

## 2019-08-18 ENCOUNTER — Other Ambulatory Visit: Payer: Self-pay

## 2019-08-18 ENCOUNTER — Encounter: Payer: Self-pay | Admitting: Emergency Medicine

## 2019-08-18 ENCOUNTER — Encounter: Payer: Self-pay | Admitting: Cardiology

## 2019-08-18 VITALS — BP 120/80 | HR 65 | Ht 69.0 in | Wt 203.0 lb

## 2019-08-18 DIAGNOSIS — I10 Essential (primary) hypertension: Secondary | ICD-10-CM

## 2019-08-18 DIAGNOSIS — I4729 Other ventricular tachycardia: Secondary | ICD-10-CM

## 2019-08-18 DIAGNOSIS — R5383 Other fatigue: Secondary | ICD-10-CM | POA: Diagnosis not present

## 2019-08-18 DIAGNOSIS — I493 Ventricular premature depolarization: Secondary | ICD-10-CM | POA: Diagnosis not present

## 2019-08-18 DIAGNOSIS — I472 Ventricular tachycardia: Secondary | ICD-10-CM

## 2019-08-18 MED ORDER — FLECAINIDE ACETATE 100 MG PO TABS: 100.0000 mg | ORAL_TABLET | Freq: Two times a day (BID) | ORAL | 2 refills | Status: DC

## 2019-08-18 NOTE — Progress Notes (Signed)
Cardiology Office Note:    Date:  08/18/2019   ID:  Anthony Rodgers, DOB 01/07/1981, MRN [id number]  PCP:  Anthony Party, NP  Cardiologist:  Anthony Ripple, DO  Electrophysiologist:  None   Referring MD: Anthony Party, NP   "I am doing better but just tired"   History of Present Illness:    Anthony Rodgers is a 39 y.o. 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  He did see Dr. Ladona Rodgers and March 2021 and his Flecainide was increased to  100 mg BID. He had a stress test on 07/12/2019 and the patient did 13.7 METS, 85% of maximum heart rate was achieved after 10.1 minutes.  Recovery time:  5 minutes.  The patient's response to exercise was adequate for diagnosis. Normal study.   The patient tells me that he has been doing well but notes that he also has been significantly fatigued.  With some muscle aches and pains.  He did tell me that he is considering getting off  light duty and work his regular duties as tolerated.  Past Medical History:  Diagnosis Date  . Family history of cardiomyopathy 02/21/2015    Past Surgical History:  Procedure Laterality Date  . HAND SURGERY Right 2001   Football injury    Current Medications: Current Meds  Medication Sig  . atorvastatin (LIPITOR) 20 MG tablet Take 20 mg by mouth daily.  . flecainide (TAMBOCOR) 100 MG tablet Take 1 tablet (100 mg total) by mouth 2 (two) times daily.  . metoprolol succinate (TOPROL XL) 25 MG 24 hr tablet Take 1 tablet (25 mg total) by mouth daily.  . nitroGLYCERIN (NITROSTAT) 0.4 MG SL tablet Place 1 tablet (0.4 mg total) under the tongue every 5 (five) minutes as needed.     Allergies:   Patient has no known allergies.   Social History   Socioeconomic History  . Marital status: Married    Spouse name: Not on file  . Number of children: Not on file  . Years of education: Not on file  . Highest education level: Not on file  Occupational History  . Not on file    Tobacco Use  . Smoking status: Never Smoker  . Smokeless tobacco: Never Used  Vaping Use  . Vaping Use: Never used  Substance and Sexual Activity  . Alcohol use: Not Currently  . Drug use: Never  . Sexual activity: Not on file  Other Topics Concern  . Not on file  Social History Narrative  . Not on file   Social Determinants of Health   Financial Resource Strain:   . Difficulty of Paying Living Expenses:   Food Insecurity:   . Worried About Programme researcher, broadcasting/film/video in the Last Year:   . Barista in the Last Year:   Transportation Needs:   . Freight forwarder (Medical):   Marland Kitchen Lack of Transportation (Non-Medical):   Physical Activity:   . Days of Exercise per Week:   . Minutes of Exercise per Session:   Stress:   . Feeling of Stress :   Social Connections:   . Frequency of Communication with Friends and Family:   . Frequency of Social Gatherings with Friends and Family:   . Attends Religious Services:   . Active Member of Clubs or Organizations:   . Attends Banker Meetings:   Marland Kitchen Marital Status:      Family History:  The patient's family history includes Arrhythmia in his brother and father; Diabetes in his mother; Heart attack in his father and paternal grandmother; Heart disease in his father and paternal grandmother; Heart failure in his father; Hyperlipidemia in his father and mother; Hypertension in his father and mother.  ROS:   Review of Systems  Constitution: Negative for decreased appetite, fever and weight gain.  HENT: Negative for congestion, ear discharge, hoarse voice and sore throat.   Eyes: Negative for discharge, redness, vision loss in right eye and visual halos.  Cardiovascular: Negative for chest pain, dyspnea on exertion, leg swelling, orthopnea and palpitations.  Respiratory: Negative for cough, hemoptysis, shortness of breath and snoring.   Endocrine: Negative for heat intolerance and polyphagia.  Hematologic/Lymphatic: Negative  for bleeding problem. Does not bruise/bleed easily.  Skin: Negative for flushing, nail changes, rash and suspicious lesions.  Musculoskeletal: Negative for arthritis, joint pain, muscle cramps, myalgias, neck pain and stiffness.  Gastrointestinal: Negative for abdominal pain, bowel incontinence, diarrhea and excessive appetite.  Genitourinary: Negative for decreased libido, genital sores and incomplete emptying.  Neurological: Negative for brief paralysis, focal weakness, headaches and loss of balance.  Psychiatric/Behavioral: Negative for altered mental status, depression and suicidal ideas.  Allergic/Immunologic: Negative for HIV exposure and persistent infections.    EKGs/Labs/Other Studies Reviewed:    The following studies were reviewed today:   EKG:  The ekg ordered today demonstrates   Exercise tolerance test The patient exercised following the Bruce protocol.   The patient reported no symptoms during the stress test. The patient experienced no angina during the stress test.   Test was stopped per protocol.    Blood pressure and heart rate demonstrated a normal response to exercise. Blood pressure demonstrated a normal response to exercise. Overall, the patient's exercise capacity was excellent.   85% of maximum heart rate was achieved after 10.1 minutes.  Recovery time:  5 minutes.  The patient's response to exercise was adequate for diagnosis.  Recent Labs: 03/01/2019: BUN 17; Creatinine, Ser 1.11; Hemoglobin 14.1; Platelets 330; Potassium 3.1; Sodium 137  Recent Lipid Panel No results found for: CHOL, TRIG, HDL, CHOLHDL, VLDL, LDLCALC, LDLDIRECT  Physical Exam:    VS:  BP 120/80   Pulse 65   Ht 5\' 9"  (1.753 m)   Wt 203 lb (92.1 kg)   SpO2 99%   BMI 29.98 kg/m     Wt Readings from Last 3 Encounters:  08/18/19 203 lb (92.1 kg)  05/30/19 201 lb (91.2 kg)  05/18/19 201 lb 6.4 oz (91.4 kg)     GEN: Well nourished, well developed in no acute distress HEENT:  Normal NECK: No JVD; No carotid bruits LYMPHATICS: No lymphadenopathy CARDIAC: S1S2 noted,RRR, no murmurs, rubs, gallops RESPIRATORY:  Clear to auscultation without rales, wheezing or rhonchi  ABDOMEN: Soft, non-tender, non-distended, +bowel sounds, no guarding. EXTREMITIES: No edema, No cyanosis, no clubbing MUSCULOSKELETAL:  No deformity  SKIN: Warm and dry NEUROLOGIC:  Alert and oriented x 3, non-focal PSYCHIATRIC:  Normal affect, good insight  ASSESSMENT:    1. Fatigue, unspecified type   2. PVC (premature ventricular contraction)   3. NSVT (nonsustained ventricular tachycardia) ([place])   4. Essential hypertension    PLAN:     1.  Fatigue, to get vitamin D level as well as B12 level completeness.  2.  Patient is recommended 100 mg twice daily along with metoprolol succinate 25 mg daily.  3.  His blood pressure deceptively in the office today we will continue  his current regimen.  4. Blood work will be also done for flecainide.     The patient wants to go back to regular duties as tolerated therefore we did give him a note for work regarding that.  The patient is in agreement with the above plan. The patient left the office in stable condition.  The patient will follow up in 6 months as tolerated.    Medication Adjustments/Labs and Tests Ordered: Current medicines are reviewed at length with the patient today.  Concerns regarding medicines are outlined above.  Orders Placed This Encounter  Procedures  . Flecainide level  . B12  . Vitamin D 1,25 dihydroxy  . EKG 12-Lead   No orders of the defined types were placed in this encounter.   Patient Instructions  Medication Instructions:  Your physician recommends that you continue on your current medications as directed. Please refer to the Current Medication list given to you today.  *If you need a refill on your cardiac medications before your next appointment, please call your pharmacy*   Lab Work: Your physician  recommends that you return for lab work today:  Vitamin b12, vitamin d, flecainide  If you have labs (blood work) drawn today and your tests are completely normal, you will receive your results only by: Marland Kitchen MyChart Message (if you have MyChart) OR . A paper copy in the mail If you have any lab test that is abnormal or we need to change your treatment, we will call you to review the results.   Testing/Procedures: None.    Follow-Up: At [facility], you and your health needs are our priority.  As part of our continuing mission to provide you with exceptional heart care, we have created designated Provider Care Teams.  These Care Teams include your primary Cardiologist (physician) and Advanced Practice Providers (APPs -  Physician Assistants and Nurse Practitioners) who all work together to provide you with the care you need, when you need it.  We recommend signing up for the patient portal called "MyChart".  Sign up information is provided on this After Visit Summary.  MyChart is used to connect with patients for Virtual Visits (Telemedicine).  Patients are able to view lab/test results, encounter notes, upcoming appointments, etc.  Non-urgent messages can be sent to your provider as well.   To learn more about what you can do with MyChart, go to [email or web address].    Your next appointment:   6 month(s)  The format for your next appointment:   In Person  Provider:   Thomasene Ripple, DO   Other Instructions       Adopting a Healthy Lifestyle.  Know what a healthy weight is for you (roughly BMI <25) and aim to maintain this   Aim for 7+ servings of fruits and vegetables daily   65-80+ fluid ounces of water or unsweet tea for healthy kidneys   Limit to max 1 drink of alcohol per day; avoid smoking/tobacco   Limit animal fats in diet for cholesterol and heart health - choose grass fed whenever available   Avoid highly processed foods, and foods high in saturated/trans  fats   Aim for low stress - take time to unwind and care for your mental health   Aim for 150 min of moderate intensity exercise weekly for heart health, and weights twice weekly for bone health   Aim for 7-9 hours of sleep daily   When it comes to diets, agreement about the perfect plan isnt easy to  find, even among the experts. Experts at the [facility] [employer] developed an idea known as the Healthy Eating Plate. Just imagine a plate divided into logical, healthy portions.   The emphasis is on diet quality:   Load up on vegetables and fruits - one-half of your plate: Aim for color and variety, and remember that potatoes dont count.   Go for whole grains - one-quarter of your plate: Whole wheat, barley, wheat berries, quinoa, oats, brown rice, and foods made with them. If you want pasta, go with whole wheat pasta.   Protein power - one-quarter of your plate: Fish, chicken, beans, and nuts are all healthy, versatile protein sources. Limit red meat.   The diet, however, does go beyond the plate, offering a few other suggestions.   Use healthy plant oils, such as olive, canola, soy, corn, sunflower and peanut. Check the labels, and avoid partially hydrogenated oil, which have unhealthy trans fats.   If youre thirsty, drink water. Coffee and tea are good in moderation, but skip sugary drinks and limit milk and dairy products to one or two daily servings.   The type of carbohydrate in the diet is more important than the amount. Some sources of carbohydrates, such as vegetables, fruits, whole grains, and beans-are healthier than others.   Finally, stay active  Signed, Anthony Ripple, DO  08/18/2019 4:39 PM    [facility]

## 2019-08-18 NOTE — Patient Instructions (Signed)
Medication Instructions:  Your physician recommends that you continue on your current medications as directed. Please refer to the Current Medication list given to you today.  *If you need a refill on your cardiac medications before your next appointment, please call your pharmacy*   Lab Work: Your physician recommends that you return for lab work today:  Vitamin b12, vitamin d, flecainide  If you have labs (blood work) drawn today and your tests are completely normal, you will receive your results only by:  MyChart Message (if you have MyChart) OR  A paper copy in the mail If you have any lab test that is abnormal or we need to change your treatment, we will call you to review the results.   Testing/Procedures: None.    Follow-Up: At [facility], you and your health needs are our priority.  As part of our continuing mission to provide you with exceptional heart care, we have created designated Provider Care Teams.  These Care Teams include your primary Cardiologist (physician) and Advanced Practice Providers (APPs -  Physician Assistants and Nurse Practitioners) who all work together to provide you with the care you need, when you need it.  We recommend signing up for the patient portal called "MyChart".  Sign up information is provided on this After Visit Summary.  MyChart is used to connect with patients for Virtual Visits (Telemedicine).  Patients are able to view lab/test results, encounter notes, upcoming appointments, etc.  Non-urgent messages can be sent to your provider as well.   To learn more about what you can do with MyChart, go to [email or web address].    Your next appointment:   6 month(s)  The format for your next appointment:   In Person  Provider:   Thomasene Ripple, DO   Other Instructions

## 2019-08-24 ENCOUNTER — Telehealth: Payer: Self-pay

## 2019-08-24 LAB — VITAMIN D 1,25 DIHYDROXY
Vitamin D 1, 25 (OH)2 Total: 59 pg/mL
Vitamin D2 1, 25 (OH)2: <10 pg/mL
Vitamin D3 1, 25 (OH)2: 59 pg/mL

## 2019-08-24 LAB — FLECAINIDE LEVEL: Flecainide: 0.35 ug/mL (ref 0.20–1.00)

## 2019-08-24 LAB — VITAMIN B12: Vitamin B-12: 526 pg/mL (ref 232–1245)

## 2019-08-24 NOTE — Telephone Encounter (Signed)
Spoke with patient regarding results and recommendation.  Patient verbalizes understanding and is agreeable to plan of care. Advised patient to call back with any issues or concerns.  

## 2019-08-24 NOTE — Telephone Encounter (Signed)
-----   Message from Alver Sorrow, NP sent at 08/24/2019  8:03 AM EDT ----- Flecainide level therapeutic. Normal B12 and vitamin D. Good result!

## 2019-08-30 ENCOUNTER — Encounter: Payer: Self-pay | Admitting: Cardiology

## 2019-10-10 ENCOUNTER — Other Ambulatory Visit: Payer: Self-pay | Admitting: Cardiology

## 2019-11-28 ENCOUNTER — Encounter: Payer: Self-pay | Admitting: Internal Medicine

## 2019-11-28 ENCOUNTER — Ambulatory Visit (INDEPENDENT_AMBULATORY_CARE_PROVIDER_SITE_OTHER): Payer: 59 | Admitting: Internal Medicine

## 2019-11-28 ENCOUNTER — Other Ambulatory Visit: Payer: Self-pay

## 2019-11-28 VITALS — BP 116/72 | HR 74 | Ht 69.0 in | Wt 201.0 lb

## 2019-11-28 DIAGNOSIS — I472 Ventricular tachycardia: Secondary | ICD-10-CM

## 2019-11-28 DIAGNOSIS — I4729 Other ventricular tachycardia: Secondary | ICD-10-CM

## 2019-11-28 DIAGNOSIS — I493 Ventricular premature depolarization: Secondary | ICD-10-CM | POA: Diagnosis not present

## 2019-11-28 DIAGNOSIS — R002 Palpitations: Secondary | ICD-10-CM

## 2019-11-28 MED ORDER — METOPROLOL SUCCINATE ER 25 MG PO TB24: 25.0000 mg | ORAL_TABLET | Freq: Every day | ORAL | 3 refills | Status: DC

## 2019-11-28 MED ORDER — FLECAINIDE ACETATE 100 MG PO TABS: 100.0000 mg | ORAL_TABLET | Freq: Two times a day (BID) | ORAL | 3 refills | Status: DC

## 2019-11-28 MED ORDER — ATORVASTATIN CALCIUM 20 MG PO TABS: 20.0000 mg | ORAL_TABLET | Freq: Every day | ORAL | 3 refills | Status: DC

## 2019-11-28 NOTE — Patient Instructions (Addendum)

## 2019-11-28 NOTE — Progress Notes (Signed)
HPI Anthony Rodgers returns today for followup. He is a pleasant 39 yo man with HTN and palpitations who has PVC's and NSVT who was started on a beta blocker and flecainide. He has done well and is back to work. He denies chest pain or sob. He has not yet been vaccinated. He notes that his symptoms are much improved.He has had rare palpitations. No Known Allergies   Current Outpatient Medications  Medication Sig Dispense Refill  . atorvastatin (LIPITOR) 20 MG tablet Take 1 tablet (20 mg total) by mouth daily. 90 tablet 3  . flecainide (TAMBOCOR) 100 MG tablet Take 1 tablet (100 mg total) by mouth 2 (two) times daily. 180 tablet 3  . metoprolol succinate (TOPROL-XL) 25 MG 24 hr tablet Take 1 tablet (25 mg total) by mouth daily. 90 tablet 3  . nitroGLYCERIN (NITROSTAT) 0.4 MG SL tablet Place 1 tablet (0.4 mg total) under the tongue every 5 (five) minutes as needed. 30 tablet 3   No current facility-administered medications for this visit.     Past Medical History:  Diagnosis Date  . Family history of cardiomyopathy 02/21/2015    ROS:   All systems reviewed and negative except as noted in the HPI.   Past Surgical History:  Procedure Laterality Date  . HAND SURGERY Right 2001   Football injury     Family History  Problem Relation Age of Onset  . Hyperlipidemia Mother   . Hypertension Mother   . Diabetes Mother   . Arrhythmia Father   . Heart attack Father   . Heart disease Father   . Heart failure Father   . Hyperlipidemia Father   . Hypertension Father   . Heart disease Paternal Grandmother   . Heart attack Paternal Grandmother   . Arrhythmia Brother      Social History   Socioeconomic History  . Marital status: Married    Spouse name: Not on file  . Number of children: Not on file  . Years of education: Not on file  . Highest education level: Not on file  Occupational History  . Not on file  Tobacco Use  . Smoking status: Never Smoker  . Smokeless  tobacco: Never Used  Vaping Use  . Vaping Use: Never used  Substance and Sexual Activity  . Alcohol use: Not Currently  . Drug use: Never  . Sexual activity: Not on file  Other Topics Concern  . Not on file  Social History Narrative  . Not on file   Social Determinants of Health   Financial Resource Strain:   . Difficulty of Paying Living Expenses: Not on file  Food Insecurity:   . Worried About Programme researcher, broadcasting/film/video in the Last Year: Not on file  . Ran Out of Food in the Last Year: Not on file  Transportation Needs:   . Lack of Transportation (Medical): Not on file  . Lack of Transportation (Non-Medical): Not on file  Physical Activity:   . Days of Exercise per Week: Not on file  . Minutes of Exercise per Session: Not on file  Stress:   . Feeling of Stress : Not on file  Social Connections:   . Frequency of Communication with Friends and Family: Not on file  . Frequency of Social Gatherings with Friends and Family: Not on file  . Attends Religious Services: Not on file  . Active Member of Clubs or Organizations: Not on file  . Attends Banker Meetings:  Not on file  . Marital Status: Not on file  Intimate Partner Violence:   . Fear of Current or Ex-Partner: Not on file  . Emotionally Abused: Not on file  . Physically Abused: Not on file  . Sexually Abused: Not on file     BP 116/72   Pulse 74   Ht 5\' 9"  (1.753 m)   Wt 201 lb (91.2 kg)   SpO2 98%   BMI 29.68 kg/m   Physical Exam:  Well appearing 39 yo man, NAD HEENT: Unremarkable Neck:  6 cm JVD, no thyromegally Lymphatics:  No adenopathy Back:  No CVA tenderness Lungs:  Clear with no wheezes HEART:  Regular rate rhythm, no murmurs, no rubs, no clicks Abd:  soft, positive bowel sounds, no organomegally, no rebound, no guarding Ext:  2 plus pulses, no edema, no cyanosis, no clubbing Skin:  No rashes no nodules Neuro:  CN II through XII intact, motor grossly intact  EKG - nsr  Assess/Plan: 1.  PVC's - his symptoms are well controlled. He will continue his current meds. 2. HTN - his bp is well controlled. No change 3. Covid vaccine - I have strongly encouraged the patient to get vaccinated from Covid 19.  20 Anthony Abramovich,MD

## 2020-02-16 NOTE — Telephone Encounter (Signed)
Error

## 2020-02-17 ENCOUNTER — Encounter: Payer: Self-pay | Admitting: Cardiology

## 2020-02-17 ENCOUNTER — Ambulatory Visit (INDEPENDENT_AMBULATORY_CARE_PROVIDER_SITE_OTHER): Payer: 59 | Admitting: Cardiology

## 2020-02-17 ENCOUNTER — Other Ambulatory Visit: Payer: Self-pay

## 2020-02-17 VITALS — BP 110/84 | HR 70 | Ht 69.0 in | Wt 204.0 lb

## 2020-02-17 DIAGNOSIS — I493 Ventricular premature depolarization: Secondary | ICD-10-CM

## 2020-02-17 DIAGNOSIS — I10 Essential (primary) hypertension: Secondary | ICD-10-CM | POA: Diagnosis not present

## 2020-02-17 DIAGNOSIS — I4729 Other ventricular tachycardia: Secondary | ICD-10-CM

## 2020-02-17 DIAGNOSIS — E669 Obesity, unspecified: Secondary | ICD-10-CM

## 2020-02-17 DIAGNOSIS — Z79899 Other long term (current) drug therapy: Secondary | ICD-10-CM | POA: Diagnosis not present

## 2020-02-17 DIAGNOSIS — I472 Ventricular tachycardia: Secondary | ICD-10-CM

## 2020-02-17 NOTE — Patient Instructions (Signed)
Medication Instructions:  Your physician recommends that you continue on your current medications as directed. Please refer to the Current Medication list given to you today.  *If you need a refill on your cardiac medications before your next appointment, please call your pharmacy*   Lab Work: Your physician recommends that you return for lab work in: TODAY Flecainide level  If you have labs (blood work) drawn today and your tests are completely normal, you will receive your results only by: Marland Kitchen MyChart Message (if you have MyChart) OR . A paper copy in the mail If you have any lab test that is abnormal or we need to change your treatment, we will call you to review the results.   Testing/Procedures: None   Follow-Up: At [facility], you and your health needs are our priority.  As part of our continuing mission to provide you with exceptional heart care, we have created designated Provider Care Teams.  These Care Teams include your primary Cardiologist (physician) and Advanced Practice Providers (APPs -  Physician Assistants and Nurse Practitioners) who all work together to provide you with the care you need, when you need it.  We recommend signing up for the patient portal called "MyChart".  Sign up information is provided on this After Visit Summary.  MyChart is used to connect with patients for Virtual Visits (Telemedicine).  Patients are able to view lab/test results, encounter notes, upcoming appointments, etc.  Non-urgent messages can be sent to your provider as well.   To learn more about what you can do with MyChart, go to [email or web address].    Your next appointment:   6 month(s)  The format for your next appointment:   In Person  Provider:   Thomasene Ripple, DO   Other Instructions

## 2020-02-17 NOTE — Progress Notes (Signed)
Cardiology Office Note:    Date:  02/17/2020   ID:  [name], DOB Jan 08, 1981, MRN [id number]  PCP:  Hurshel Party, NP  Cardiologist:  Thomasene Ripple, DO  Electrophysiologist:  None   Referring MD: Hurshel Party, NP   Chief Complaint  Patient presents with  . Follow-up   History of Present Illness:    Anthony Rodgers is a 39 y.o. male with a hx of hypertension, NSVT, PVCs who was started on beta-blocker and flecainide has been doing well since flecainide.  He has not had any episode of palpitations.  Recently he had significant abdominal pain and saw his PCP who sent him for CT scan CT scan showed evidence of diverticulosis and epiploic appendagitis.  He was treated with some medications he tells me. Symptoms has improved tremendously.   Past Medical History:  Diagnosis Date  . Chest pain 02/17/2019  . Encounter for monitoring flecainide therapy 05/18/2019  . Family history of cardiomyopathy 02/21/2015  . NSVT (nonsustained ventricular tachycardia) (HCC) 05/18/2019  . Palpitations 02/17/2019  . Pre-procedure lab exam 02/17/2019  . PVC (premature ventricular contraction) 02/17/2019  . Shortness of breath 02/17/2019    Past Surgical History:  Procedure Laterality Date  . HAND SURGERY Right 2001   Football injury    Current Medications: Current Meds  Medication Sig  . atorvastatin (LIPITOR) 20 MG tablet Take 1 tablet (20 mg total) by mouth daily.  . flecainide (TAMBOCOR) 100 MG tablet Take 1 tablet (100 mg total) by mouth 2 (two) times daily.  . metoprolol succinate (TOPROL-XL) 25 MG 24 hr tablet Take 1 tablet (25 mg total) by mouth daily.  . [DISCONTINUED] nitroGLYCERIN (NITROSTAT) 0.4 MG SL tablet Place 1 tablet (0.4 mg total) under the tongue every 5 (five) minutes as needed.     Allergies:   Patient has no known allergies.   Social History   Socioeconomic History  . Marital status: Married    Spouse name: Not on file  . Number of children: Not on file  .  Years of education: Not on file  . Highest education level: Not on file  Occupational History  . Not on file  Tobacco Use  . Smoking status: Never Smoker  . Smokeless tobacco: Never Used  Vaping Use  . Vaping Use: Never used  Substance and Sexual Activity  . Alcohol use: Not Currently  . Drug use: Never  . Sexual activity: Not on file  Other Topics Concern  . Not on file  Social History Narrative  . Not on file   Social Determinants of Health   Financial Resource Strain: Not on file  Food Insecurity: Not on file  Transportation Needs: Not on file  Physical Activity: Not on file  Stress: Not on file  Social Connections: Not on file     Family History: The patient's family history includes Arrhythmia in his brother and father; Diabetes in his mother; Heart attack in his father and paternal grandmother; Heart disease in his father and paternal grandmother; Heart failure in his father; Hyperlipidemia in his father and mother; Hypertension in his father and mother.  ROS:   Review of Systems  Constitution: Negative for decreased appetite, fever and weight gain.  HENT: Negative for congestion, ear discharge, hoarse voice and sore throat.   Eyes: Negative for discharge, redness, vision loss in right eye and visual halos.  Cardiovascular: Negative for chest pain, dyspnea on exertion, leg swelling, orthopnea and palpitations.  Respiratory: Negative for cough,  hemoptysis, shortness of breath and snoring.   Endocrine: Negative for heat intolerance and polyphagia.  Hematologic/Lymphatic: Negative for bleeding problem. Does not bruise/bleed easily.  Skin: Negative for flushing, nail changes, rash and suspicious lesions.  Musculoskeletal: Negative for arthritis, joint pain, muscle cramps, myalgias, neck pain and stiffness.  Gastrointestinal: Negative for abdominal pain, bowel incontinence, diarrhea and excessive appetite.  Genitourinary: Negative for decreased libido, genital sores and  incomplete emptying.  Neurological: Negative for brief paralysis, focal weakness, headaches and loss of balance.  Psychiatric/Behavioral: Negative for altered mental status, depression and suicidal ideas.  Allergic/Immunologic: Negative for HIV exposure and persistent infections.    EKGs/Labs/Other Studies Reviewed:    The following studies were reviewed today:   EKG: None today  CCTA IMPRESSION: 1. Coronary calcium score of 0. This was 0 percentile for age and sex matched control.  2. Normal coronary origin with right dominance.  3. No evidence of CAD; CADRADS-0.  Recent Labs: 03/01/2019: BUN 17; Creatinine, Ser 1.11; Hemoglobin 14.1; Platelets 330; Potassium 3.1; Sodium 137  Recent Lipid Panel No results found for: CHOL, TRIG, HDL, CHOLHDL, VLDL, LDLCALC, LDLDIRECT  Physical Exam:    VS:  BP 110/84 (BP Location: Right Arm, Patient Position: Sitting, Cuff Size: Normal)   Pulse 70   Ht 5\' 9"  (1.753 m)   Wt 204 lb (92.5 kg)   SpO2 98%   BMI 30.13 kg/m     Wt Readings from Last 3 Encounters:  02/17/20 204 lb (92.5 kg)  11/28/19 201 lb (91.2 kg)  08/18/19 203 lb (92.1 kg)     GEN: Well nourished, well developed in no acute distress HEENT: Normal NECK: No JVD; No carotid bruits LYMPHATICS: No lymphadenopathy CARDIAC: S1S2 noted,RRR, no murmurs, rubs, gallops RESPIRATORY:  Clear to auscultation without rales, wheezing or rhonchi  ABDOMEN: Soft, non-tender, non-distended, +bowel sounds, no guarding. EXTREMITIES: No edema, No cyanosis, no clubbing MUSCULOSKELETAL:  No deformity  SKIN: Warm and dry NEUROLOGIC:  Alert and oriented x 3, non-focal PSYCHIATRIC:  Normal affect, good insight  ASSESSMENT:    1. PVC (premature ventricular contraction)   2. High risk medication use   3. NSVT (nonsustained ventricular tachycardia) (HCC)   4. Essential hypertension   5. Obesity (BMI 30-39.9)    PLAN:     1.  His blood pressure deceptively in the office no changes  will be made. 2.  PVC/NSVT he is on flecainide and beta-blocker.  We will continue this medication regimen.  We will check flecainide level today. 3.  The patient understands the need to lose weight with diet and exercise. We have discussed specific strategies for this.  The patient is in agreement with the above plan. The patient left the office in stable condition.  The patient will follow up in 6 months or sooner if needed.   Medication Adjustments/Labs and Tests Ordered: Current medicines are reviewed at length with the patient today.  Concerns regarding medicines are outlined above.  Orders Placed This Encounter  Procedures  . Flecainide level   No orders of the defined types were placed in this encounter.   Patient Instructions  Medication Instructions:  Your physician recommends that you continue on your current medications as directed. Please refer to the Current Medication list given to you today.  *If you need a refill on your cardiac medications before your next appointment, please call your pharmacy*   Lab Work: Your physician recommends that you return for lab work in: TODAY Flecainide level  If you have  labs (blood work) drawn today and your tests are completely normal, you will receive your results only by: Marland Kitchen MyChart Message (if you have MyChart) OR . A paper copy in the mail If you have any lab test that is abnormal or we need to change your treatment, we will call you to review the results.   Testing/Procedures: None   Follow-Up: At [facility], you and your health needs are our priority.  As part of our continuing mission to provide you with exceptional heart care, we have created designated Provider Care Teams.  These Care Teams include your primary Cardiologist (physician) and Advanced Practice Providers (APPs -  Physician Assistants and Nurse Practitioners) who all work together to provide you with the care you need, when you need it.  We recommend  signing up for the patient portal called "MyChart".  Sign up information is provided on this After Visit Summary.  MyChart is used to connect with patients for Virtual Visits (Telemedicine).  Patients are able to view lab/test results, encounter notes, upcoming appointments, etc.  Non-urgent messages can be sent to your provider as well.   To learn more about what you can do with MyChart, go to [email or web address].    Your next appointment:   6 month(s)  The format for your next appointment:   In Person  Provider:   Thomasene Ripple, DO   Other Instructions      Adopting a Healthy Lifestyle.  Know what a healthy weight is for you (roughly BMI <25) and aim to maintain this   Aim for 7+ servings of fruits and vegetables daily   65-80+ fluid ounces of water or unsweet tea for healthy kidneys   Limit to max 1 drink of alcohol per day; avoid smoking/tobacco   Limit animal fats in diet for cholesterol and heart health - choose grass fed whenever available   Avoid highly processed foods, and foods high in saturated/trans fats   Aim for low stress - take time to unwind and care for your mental health   Aim for 150 min of moderate intensity exercise weekly for heart health, and weights twice weekly for bone health   Aim for 7-9 hours of sleep daily   When it comes to diets, agreement about the perfect plan isnt easy to find, even among the experts. Experts at the [facility] [employer] developed an idea known as the Healthy Eating Plate. Just imagine a plate divided into logical, healthy portions.   The emphasis is on diet quality:   Load up on vegetables and fruits - one-half of your plate: Aim for color and variety, and remember that potatoes dont count.   Go for whole grains - one-quarter of your plate: Whole wheat, barley, wheat berries, quinoa, oats, brown rice, and foods made with them. If you want pasta, go with whole wheat pasta.   Protein power - one-quarter  of your plate: Fish, chicken, beans, and nuts are all healthy, versatile protein sources. Limit red meat.   The diet, however, does go beyond the plate, offering a few other suggestions.   Use healthy plant oils, such as olive, canola, soy, corn, sunflower and peanut. Check the labels, and avoid partially hydrogenated oil, which have unhealthy trans fats.   If youre thirsty, drink water. Coffee and tea are good in moderation, but skip sugary drinks and limit milk and dairy products to one or two daily servings.   The type of carbohydrate in the diet is more important  than the amount. Some sources of carbohydrates, such as vegetables, fruits, whole grains, and beans-are healthier than others.   Finally, stay active  Signed, Thomasene RippleKardie Kaylin Marcon, DO  02/17/2020 4:00 PM    [facility]

## 2020-02-21 LAB — FLECAINIDE LEVEL: Flecainide: 0.47 ug/mL (ref 0.20–1.00)

## 2020-08-09 ENCOUNTER — Other Ambulatory Visit: Payer: Self-pay

## 2020-08-09 ENCOUNTER — Ambulatory Visit (INDEPENDENT_AMBULATORY_CARE_PROVIDER_SITE_OTHER): Payer: BC Managed Care – PPO | Admitting: Cardiology

## 2020-08-09 ENCOUNTER — Encounter: Payer: Self-pay | Admitting: Cardiology

## 2020-08-09 VITALS — BP 122/86 | HR 73 | Ht 68.0 in | Wt 208.8 lb

## 2020-08-09 DIAGNOSIS — E669 Obesity, unspecified: Secondary | ICD-10-CM

## 2020-08-09 DIAGNOSIS — I472 Ventricular tachycardia: Secondary | ICD-10-CM | POA: Diagnosis not present

## 2020-08-09 DIAGNOSIS — Z79899 Other long term (current) drug therapy: Secondary | ICD-10-CM

## 2020-08-09 DIAGNOSIS — I10 Essential (primary) hypertension: Secondary | ICD-10-CM | POA: Diagnosis not present

## 2020-08-09 DIAGNOSIS — I493 Ventricular premature depolarization: Secondary | ICD-10-CM

## 2020-08-09 DIAGNOSIS — I4729 Other ventricular tachycardia: Secondary | ICD-10-CM

## 2020-08-09 NOTE — Progress Notes (Signed)
Cardiology Office Note:    Date:  08/09/2020   ID:  Anthony Rodgers, DOB 1980/11/23, MRN [id number]  PCP:  Hurshel Party, NP  Cardiologist:  Thomasene Ripple, DO  Electrophysiologist:  None   Referring MD: Hurshel Party, NP   I am doing well  History of Present Illness:    Anthony Rodgers is a 40 y.o. male with a hx of hypertension, NSVT, PAF symptomatic PVCs now on low-dose metoprolol as well as flecainide is here today for his follow-up visit.  Patient tells me since his last time I saw him he had not had any episodes of he is happy with the way his symptoms has resolved.  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  Past Medical History:  Diagnosis Date  . Chest pain 02/17/2019  . Encounter for monitoring flecainide therapy 05/18/2019  . Family history of cardiomyopathy 02/21/2015  . NSVT (nonsustained ventricular tachycardia) (HCC) 05/18/2019  . Palpitations 02/17/2019  . Pre-procedure lab exam 02/17/2019  . PVC (premature ventricular contraction) 02/17/2019  . Shortness of breath 02/17/2019    Past Surgical History:  Procedure Laterality Date  . HAND SURGERY Right 2001   Football injury    Current Medications: Current Meds  Medication Sig  . atorvastatin (LIPITOR) 20 MG tablet Take 1 tablet (20 mg total) by mouth daily.  . flecainide (TAMBOCOR) 100 MG tablet Take 1 tablet (100 mg total) by mouth 2 (two) times daily.  . metoprolol succinate (TOPROL-XL) 25 MG 24 hr tablet Take 1 tablet (25 mg total) by mouth daily.     Allergies:   Patient has no known allergies.   Social History   Socioeconomic History  . Marital status: Married    Spouse name: Not on file  . Number of children: Not on file  . Years of education: Not on file  . Highest education level: Not on file  Occupational History  . Not on file  Tobacco Use  . Smoking status: Never Smoker  . Smokeless tobacco: Never Used  Vaping Use  . Vaping Use:  Never used  Substance and Sexual Activity  . Alcohol use: Not Currently  . Drug use: Never  . Sexual activity: Not on file  Other Topics Concern  . Not on file  Social History Narrative  . Not on file   Social Determinants of Health   Financial Resource Strain: Not on file  Food Insecurity: Not on file  Transportation Needs: Not on file  Physical Activity: Not on file  Stress: Not on file  Social Connections: Not on file     Family History: The patient's family history includes Arrhythmia in his brother and father; Diabetes in his mother; Heart attack in his father and paternal grandmother; Heart disease in his father and paternal grandmother; Heart failure in his father; Hyperlipidemia in his father and mother; Hypertension in his father and mother.  ROS:   Review of Systems  Constitution: Negative for decreased appetite, fever and weight gain.  HENT: Negative for congestion, ear discharge, hoarse voice and sore throat.   Eyes: Negative for discharge, redness, vision loss in right eye and visual halos.  Cardiovascular: Negative for chest pain, dyspnea on exertion, leg swelling, orthopnea and palpitations.  Respiratory: Negative for cough, hemoptysis, shortness of breath and snoring.   Endocrine: Negative for heat intolerance and polyphagia.  Hematologic/Lymphatic: Negative for bleeding problem. Does not bruise/bleed easily.  Skin: Negative for  flushing, nail changes, rash and suspicious lesions.  Musculoskeletal: Negative for arthritis, joint pain, muscle cramps, myalgias, neck pain and stiffness.  Gastrointestinal: Negative for abdominal pain, bowel incontinence, diarrhea and excessive appetite.  Genitourinary: Negative for decreased libido, genital sores and incomplete emptying.  Neurological: Negative for brief paralysis, focal weakness, headaches and loss of balance.  Psychiatric/Behavioral: Negative for altered mental status, depression and suicidal ideas.   Allergic/Immunologic: Negative for HIV exposure and persistent infections.    EKGs/Labs/Other Studies Reviewed:    The following studies were reviewed today:   EKG: None today  Recent Labs: No results found for requested labs within last 8760 hours.  Recent Lipid Panel No results found for: CHOL, TRIG, HDL, CHOLHDL, VLDL, LDLCALC, LDLDIRECT  Physical Exam:    VS:  BP 122/86   Pulse 73   Ht 5\' 8"  (1.727 m)   Wt 208 lb 12.8 oz (94.7 kg)   SpO2 97%   BMI 31.75 kg/m     Wt Readings from Last 3 Encounters:  08/09/20 208 lb 12.8 oz (94.7 kg)  02/17/20 204 lb (92.5 kg)  11/28/19 201 lb (91.2 kg)     GEN: Well nourished, well developed in no acute distress HEENT: Normal NECK: No JVD; No carotid bruits LYMPHATICS: No lymphadenopathy CARDIAC: S1S2 noted,RRR, no murmurs, rubs, gallops RESPIRATORY:  Clear to auscultation without rales, wheezing or rhonchi  ABDOMEN: Soft, non-tender, non-distended, +bowel sounds, no guarding. EXTREMITIES: No edema, No cyanosis, no clubbing MUSCULOSKELETAL:  No deformity  SKIN: Warm and dry NEUROLOGIC:  Alert and oriented x 3, non-focal PSYCHIATRIC:  Normal affect, good insight  ASSESSMENT:    1. PVC (premature ventricular contraction)   2. NSVT (nonsustained ventricular tachycardia) (HCC)   3. Essential hypertension   4. High risk medication use   5. Obesity (BMI 30-39.9)    PLAN:    He appears to be doing well from a cardiovascular standpoint.  He has not had any repeated symptoms.  We will continue patient on his low-dose beta-blocker as well as flecainide 100 mg twice a day.  I will get flecainide level today.  The patient understands the need to lose weight with diet and exercise. We have discussed specific strategies for this.  The patient is in agreement with the above plan. The patient left the office in stable condition.  The patient will follow up in   Medication Adjustments/Labs and Tests Ordered: Current medicines are  reviewed at length with the patient today.  Concerns regarding medicines are outlined above.  No orders of the defined types were placed in this encounter.  No orders of the defined types were placed in this encounter.   There are no Patient Instructions on file for this visit.   Adopting a Healthy Lifestyle.  Know what a healthy weight is for you (roughly BMI <25) and aim to maintain this   Aim for 7+ servings of fruits and vegetables daily   65-80+ fluid ounces of water or unsweet tea for healthy kidneys   Limit to max 1 drink of alcohol per day; avoid smoking/tobacco   Limit animal fats in diet for cholesterol and heart health - choose grass fed whenever available   Avoid highly processed foods, and foods high in saturated/trans fats   Aim for low stress - take time to unwind and care for your mental health   Aim for 150 min of moderate intensity exercise weekly for heart health, and weights twice weekly for bone health   Aim for 7-9  hours of sleep daily   When it comes to diets, agreement about the perfect plan isnt easy to find, even among the experts. Experts at the [facility] [employer] developed an idea known as the Healthy Eating Plate. Just imagine a plate divided into logical, healthy portions.   The emphasis is on diet quality:   Load up on vegetables and fruits - one-half of your plate: Aim for color and variety, and remember that potatoes dont count.   Go for whole grains - one-quarter of your plate: Whole wheat, barley, wheat berries, quinoa, oats, brown rice, and foods made with them. If you want pasta, go with whole wheat pasta.   Protein power - one-quarter of your plate: Fish, chicken, beans, and nuts are all healthy, versatile protein sources. Limit red meat.   The diet, however, does go beyond the plate, offering a few other suggestions.   Use healthy plant oils, such as olive, canola, soy, corn, sunflower and peanut. Check the labels, and  avoid partially hydrogenated oil, which have unhealthy trans fats.   If youre thirsty, drink water. Coffee and tea are good in moderation, but skip sugary drinks and limit milk and dairy products to one or two daily servings.   The type of carbohydrate in the diet is more important than the amount. Some sources of carbohydrates, such as vegetables, fruits, whole grains, and beans-are healthier than others.   Finally, stay active  Signed, Thomasene Ripple, DO  08/09/2020 4:24 PM    [facility]

## 2020-08-09 NOTE — Patient Instructions (Signed)
Medication Instructions:  Your physician recommends that you continue on your current medications as directed. Please refer to the Current Medication list given to you today.  *If you need a refill on your cardiac medications before your next appointment, please call your pharmacy*   Lab Work: Your physician recommends that you return for lab work: TODAY: Flecainide level If you have labs (blood work) drawn today and your tests are completely normal, you will receive your results only by: Marland Kitchen MyChart Message (if you have MyChart) OR . A paper copy in the mail If you have any lab test that is abnormal or we need to change your treatment, we will call you to review the results.   Testing/Procedures: None   Follow-Up: At [facility], you and your health needs are our priority.  As part of our continuing mission to provide you with exceptional heart care, we have created designated Provider Care Teams.  These Care Teams include your primary Cardiologist (physician) and Advanced Practice Providers (APPs -  Physician Assistants and Nurse Practitioners) who all work together to provide you with the care you need, when you need it.  We recommend signing up for the patient portal called "MyChart".  Sign up information is provided on this After Visit Summary.  MyChart is used to connect with patients for Virtual Visits (Telemedicine).  Patients are able to view lab/test results, encounter notes, upcoming appointments, etc.  Non-urgent messages can be sent to your provider as well.   To learn more about what you can do with MyChart, go to [email or web address].    Your next appointment:   1 year(s)  The format for your next appointment:   In Person  Provider:   Thomasene Ripple, DO   Other Instructions

## 2020-08-11 LAB — FLECAINIDE LEVEL: Flecainide: 0.4 ug/mL (ref 0.20–1.00)

## 2020-08-15 ENCOUNTER — Telehealth: Payer: Self-pay

## 2020-08-15 NOTE — Telephone Encounter (Signed)
-----   Message from Thomasene Ripple, DO sent at 08/14/2020  4:30 PM EDT ----- Labs normal

## 2020-08-15 NOTE — Telephone Encounter (Signed)
Spoke with patient regarding results and recommendation.  Patient verbalizes understanding and is agreeable to plan of care. Advised patient to call back with any issues or concerns.  

## 2020-10-01 DIAGNOSIS — Z1331 Encounter for screening for depression: Secondary | ICD-10-CM | POA: Diagnosis not present

## 2020-10-01 DIAGNOSIS — I10 Essential (primary) hypertension: Secondary | ICD-10-CM | POA: Diagnosis not present

## 2020-10-01 DIAGNOSIS — E785 Hyperlipidemia, unspecified: Secondary | ICD-10-CM | POA: Diagnosis not present

## 2020-10-01 DIAGNOSIS — Z6831 Body mass index (BMI) 31.0-31.9, adult: Secondary | ICD-10-CM | POA: Diagnosis not present

## 2020-10-01 DIAGNOSIS — I472 Ventricular tachycardia: Secondary | ICD-10-CM | POA: Diagnosis not present

## 2020-10-30 ENCOUNTER — Other Ambulatory Visit: Payer: Self-pay | Admitting: Internal Medicine

## 2021-01-16 ENCOUNTER — Other Ambulatory Visit: Payer: Self-pay | Admitting: Internal Medicine

## 2021-01-21 ENCOUNTER — Other Ambulatory Visit: Payer: Self-pay | Admitting: Internal Medicine

## 2021-01-28 ENCOUNTER — Other Ambulatory Visit: Payer: Self-pay | Admitting: Internal Medicine

## 2021-03-12 ENCOUNTER — Other Ambulatory Visit: Payer: Self-pay | Admitting: Internal Medicine

## 2021-04-04 DIAGNOSIS — E785 Hyperlipidemia, unspecified: Secondary | ICD-10-CM | POA: Diagnosis not present

## 2021-04-04 DIAGNOSIS — I4729 Other ventricular tachycardia: Secondary | ICD-10-CM | POA: Diagnosis not present

## 2021-04-04 DIAGNOSIS — R739 Hyperglycemia, unspecified: Secondary | ICD-10-CM | POA: Diagnosis not present

## 2021-04-04 DIAGNOSIS — I10 Essential (primary) hypertension: Secondary | ICD-10-CM | POA: Diagnosis not present

## 2021-06-14 ENCOUNTER — Ambulatory Visit (INDEPENDENT_AMBULATORY_CARE_PROVIDER_SITE_OTHER): Payer: BC Managed Care – PPO | Admitting: Internal Medicine

## 2021-06-14 ENCOUNTER — Encounter: Payer: Self-pay | Admitting: Internal Medicine

## 2021-06-14 VITALS — BP 112/72 | HR 71 | Ht 69.0 in | Wt 212.8 lb

## 2021-06-14 DIAGNOSIS — I10 Essential (primary) hypertension: Secondary | ICD-10-CM | POA: Diagnosis not present

## 2021-06-14 DIAGNOSIS — I493 Ventricular premature depolarization: Secondary | ICD-10-CM | POA: Diagnosis not present

## 2021-06-14 MED ORDER — METOPROLOL SUCCINATE ER 25 MG PO TB24: 25.0000 mg | ORAL_TABLET | Freq: Every day | ORAL | 3 refills | Status: DC

## 2021-06-14 MED ORDER — FLECAINIDE ACETATE 100 MG PO TABS: 100.0000 mg | ORAL_TABLET | Freq: Two times a day (BID) | ORAL | 3 refills | Status: DC

## 2021-06-14 MED ORDER — ATORVASTATIN CALCIUM 20 MG PO TABS: 20.0000 mg | ORAL_TABLET | Freq: Every day | ORAL | 3 refills | Status: DC

## 2021-06-14 NOTE — Progress Notes (Signed)
? ? ? ? ?HPI ?Mr. Radigan returns today for followup. He is a pleasant 41 yo man with HTN and palpitations who has PVC's and NSVT who was started on a beta blocker and flecainide. He has done well and is back to work. He denies chest pain or sob. He notes that his symptoms are much improved. He has had rare palpitations. He has gotten a promotion at work and notes less physical stress but more mental stress. ? ?No Known Allergies ? ? ?Current Outpatient Medications  ?Medication Sig Dispense Refill  ? atorvastatin (LIPITOR) 20 MG tablet Take 1 tablet (20 mg total) by mouth daily. 90 tablet 3  ? flecainide (TAMBOCOR) 100 MG tablet Take 1 tablet (100 mg total) by mouth 2 (two) times daily. 180 tablet 3  ? metoprolol succinate (TOPROL-XL) 25 MG 24 hr tablet Take 1 tablet (25 mg total) by mouth daily. 90 tablet 3  ? ?No current facility-administered medications for this visit.  ? ? ? ?Past Medical History:  ?Diagnosis Date  ? Chest pain 02/17/2019  ? Encounter for monitoring flecainide therapy 05/18/2019  ? Family history of cardiomyopathy 02/21/2015  ? 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 Palpitations 02/17/2019  ? Pre-procedure lab exam 02/17/2019  ? PVC (premature ventricular contraction) 02/17/2019  ? Shortness of breath 02/17/2019  ? ? ?ROS: ? ? All systems reviewed and negative except as noted in the HPI. ? ? ?Past Surgical History:  ?Procedure Laterality Date  ? HAND SURGERY Right 2001  ? Football injury  ? ? ? ?Family History  ?Problem Relation Age of Onset  ? Hyperlipidemia Mother   ? Hypertension Mother   ? Diabetes Mother   ? Arrhythmia Father   ? Heart attack Father   ? Heart disease Father   ? Heart failure Father   ? Hyperlipidemia Father   ? Hypertension Father   ? Heart disease Paternal Grandmother   ? Heart attack Paternal Grandmother   ? Arrhythmia Brother   ? ? ? ?Social History  ? ?Socioeconomic History  ? Marital status: Married  ?  Spouse name: Not on file  ? Number of  children: Not on file  ? Years of education: Not on file  ? Highest education level: Not on file  ?Occupational History  ? Not on file  ?Tobacco Use  ? Smoking status: Never  ? Smokeless tobacco: Never  ?Vaping Use  ? Vaping Use: Never used  ?Substance and Sexual Activity  ? Alcohol use: Not Currently  ? Drug use: Never  ? Sexual activity: Not on file  ?Other Topics Concern  ? Not on file  ?Social History Narrative  ? Not on file  ? ?Social Determinants of Health  ? ?Financial Resource Strain: Not on file  ?Food Insecurity: Not on file  ?Transportation Needs: Not on file  ?Physical Activity: Not on file  ?Stress: Not on file  ?Social Connections: Not on file  ?Intimate Partner Violence: Not on file  ? ? ? ?BP 112/72   Pulse 71   Ht 5\' 9"  (1.753 m)   Wt 212 lb 12.8 oz (96.5 kg)   SpO2 99%   BMI 31.43 kg/m?  ? ?Physical Exam: ? ?Well appearing NAD ?HEENT: Unremarkable ?Neck:  No JVD, no thyromegally ?Lymphatics:  No adenopathy ?Back:  No CVA tenderness ?Lungs:  Clear with no wheezes ?HEART:  Regular rate rhythm, no murmurs, no rubs, no clicks ?Abd:  soft, positive bowel sounds, no organomegally, no rebound, no guarding ?  Ext:  2 plus pulses, no edema, no cyanosis, no clubbing ?Skin:  No rashes no nodules ?Neuro:  CN II through XII intact, motor grossly intact ? ?EKG - nsr ? ? 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 He will continue his beta blocker and flecainide. I'll see him back in a year. If his arrhythmias are controlled we will consider weaning off his AA drug therapy. ?HTN - his bp is well controlled. No change in meds. ? ?Sharlot Gowda Tor Tsuda,MD ?

## 2021-06-14 NOTE — Patient Instructions (Signed)
Medication Instructions:  ?Your physician recommends that you continue on your current medications as directed. Please refer to the Current Medication list given to you today. ? ?Labwork: ?None ordered. ? ?Testing/Procedures: ?None ordered. ? ?Follow-Up: ?Your physician wants you to follow-up in: one year with Gregg Taylor, MD or one of the following Advanced Practice Providers on your designated Care Team:   ?Renee Ursuy, PA-C ?Michael "Andy" Tillery, PA-C ?You will receive a reminder letter in the mail two months in advance. If you don't receive a letter, please call our office to schedule the follow-up appointment. ? ? ?Any Other Special Instructions Will Be Listed Below (If Applicable). ? ?If you need a refill on your cardiac medications before your next appointment, please call your pharmacy.  ? ? ? ? ?

## 2021-07-14 DIAGNOSIS — W19XXXA Unspecified fall, initial encounter: Secondary | ICD-10-CM | POA: Diagnosis not present

## 2021-07-14 DIAGNOSIS — S99812A Other specified injuries of left ankle, initial encounter: Secondary | ICD-10-CM | POA: Diagnosis not present

## 2021-08-14 IMAGING — CT CT HEART MORP W/ CTA COR W/ SCORE W/ CA W/CM &/OR W/O CM
2 series · 16 of 20 positions shown, 18 images · IV contrast (APPLIED)
Comparison: None.
COMPARISON: None.

Addendum:
EXAM:
OVER-READ INTERPRETATION  CT CHEST

The following report is an over-read performed by radiologist Dr.
Dileep Nodarse [REDACTED] on 03/03/2019. This
over-read does not include interpretation of cardiac or coronary
anatomy or pathology. The coronary CTA interpretation by the
cardiologist is attached.
CLINICAL DATA: 38 yo male with chest pain
Cardiac/Coronary  CT
TECHNIQUE: The patient was scanned on a [employer] Force scanner.

[Series 6: best diast 74 % · axial · 0.39mm/px · z∈[+1173,+1284]mm · 8 of 356 slices shown, 10 images]
[im 40/356  vessel]
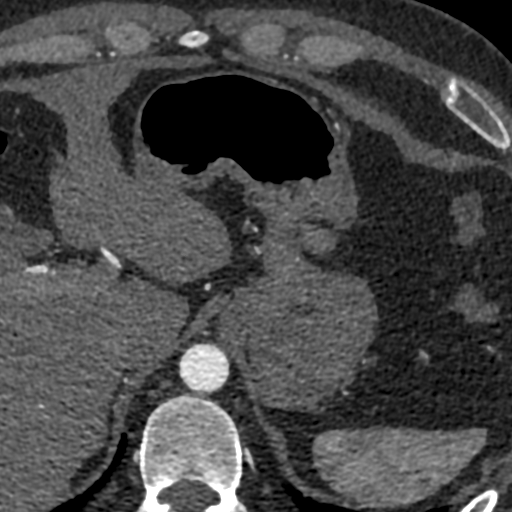
[im 40/356  lung]
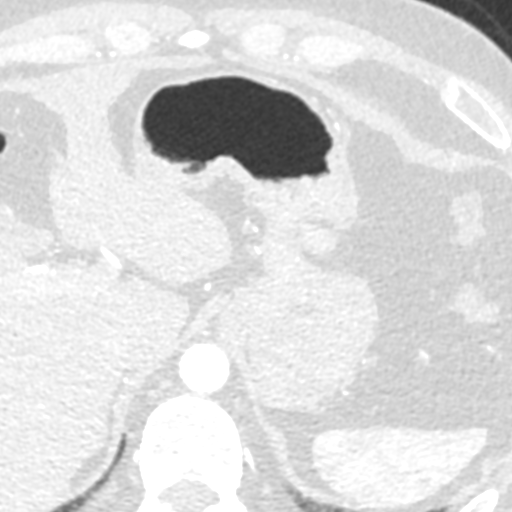
[im 79/356  vessel]
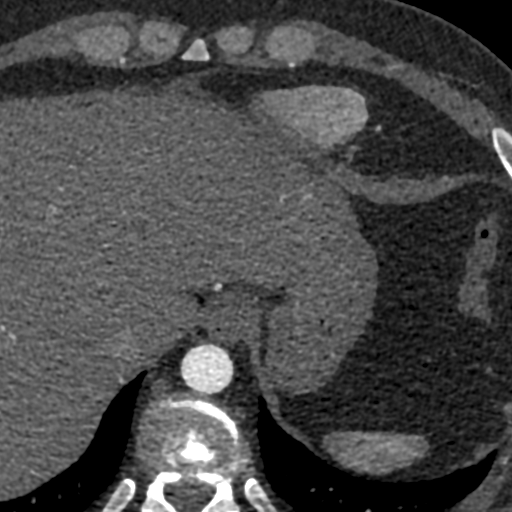
[im 119/356  vessel]
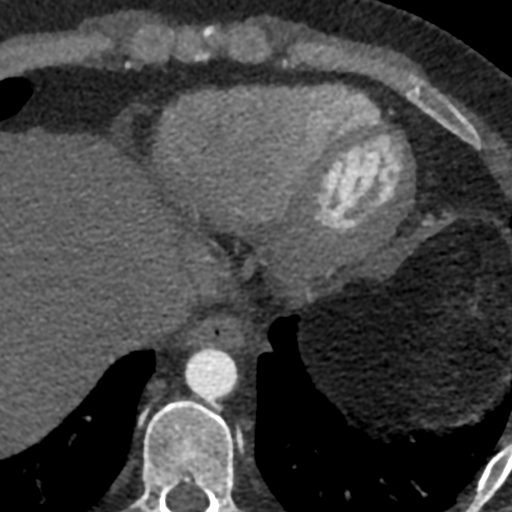
[im 158/356  vessel]
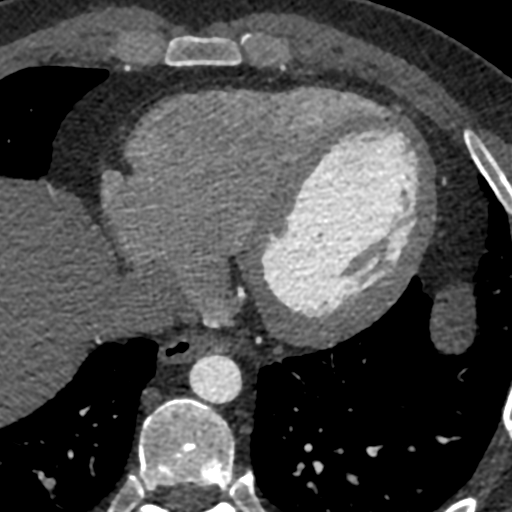
[im 198/356  vessel]
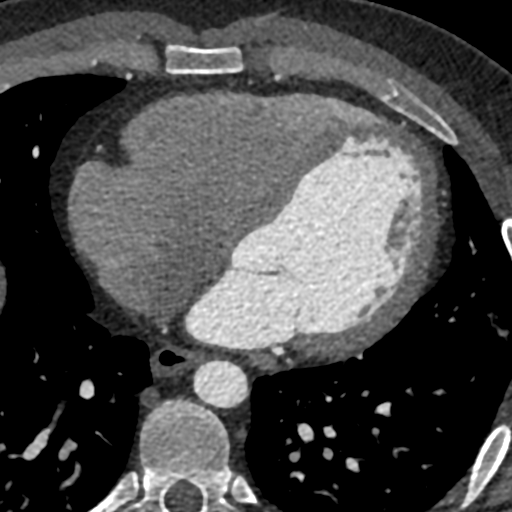
[im 198/356  lung]
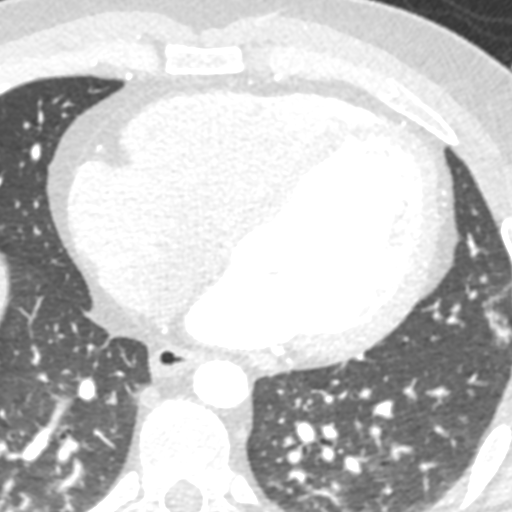
[im 237/356  vessel]
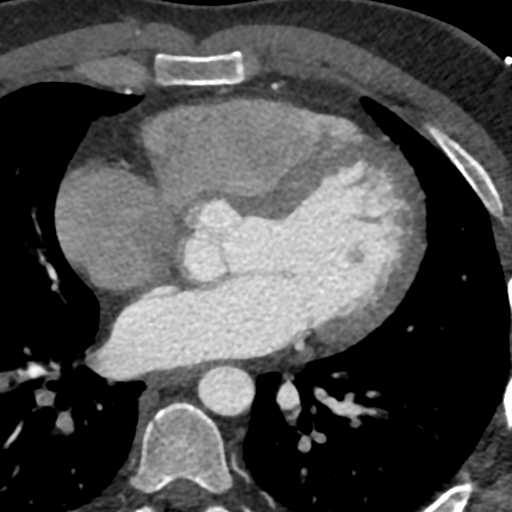
[im 277/356  vessel]
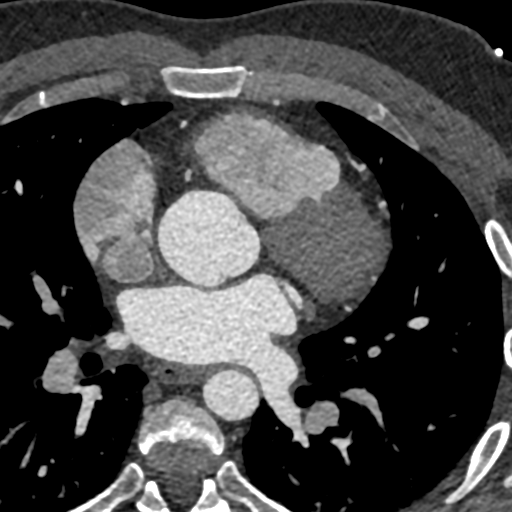
[im 316/356  vessel]
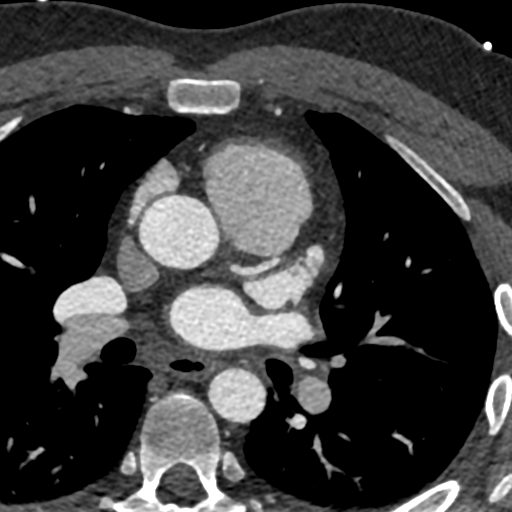

[Series 8: ts diast sharp 37 % · axial · 0.39mm/px · z∈[+1173,+1284]mm · 8 of 356 slices shown]
[im 40/356  lung]
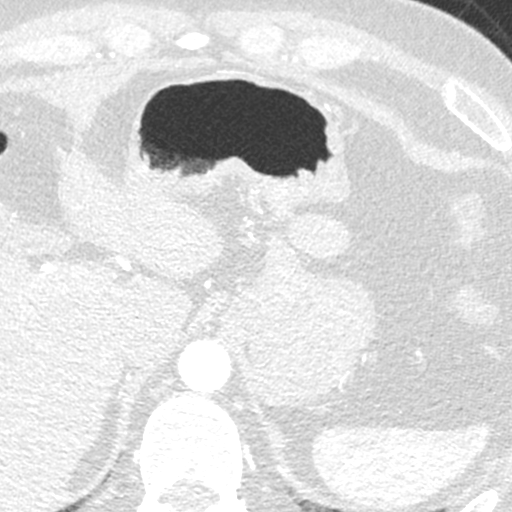
[im 79/356  lung]
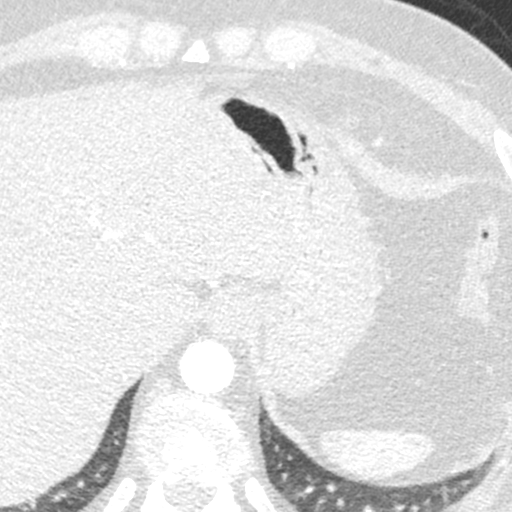
[im 119/356  lung]
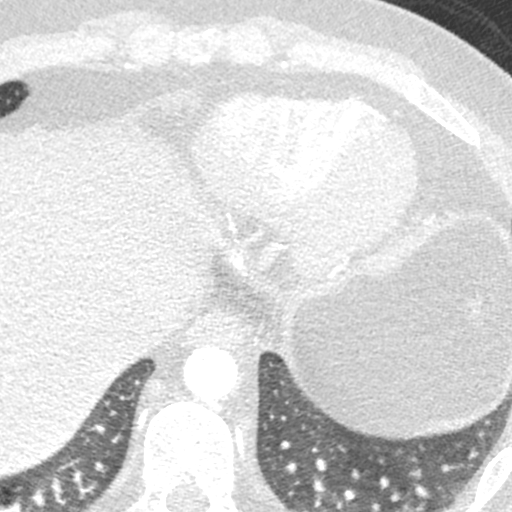
[im 158/356  lung]
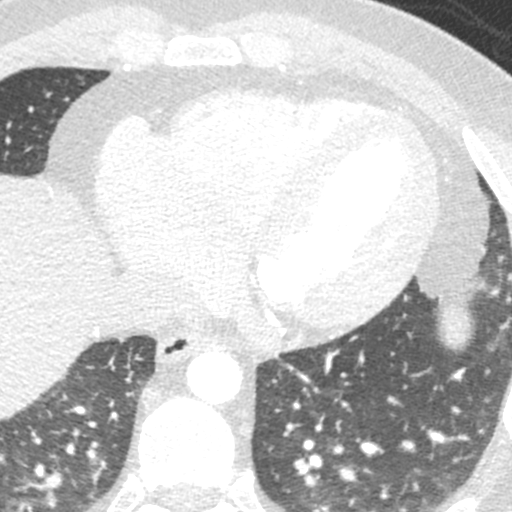
[im 198/356  lung]
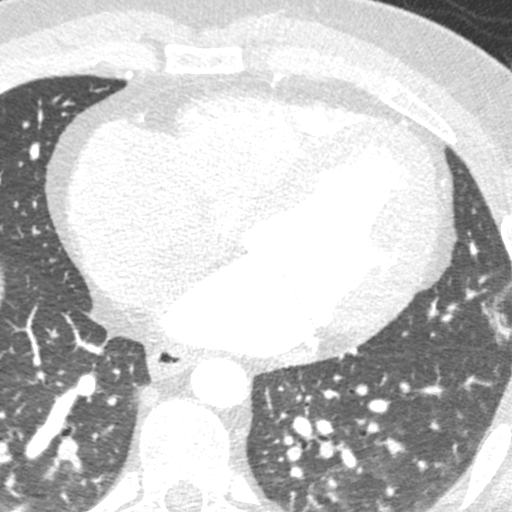
[im 237/356  lung]
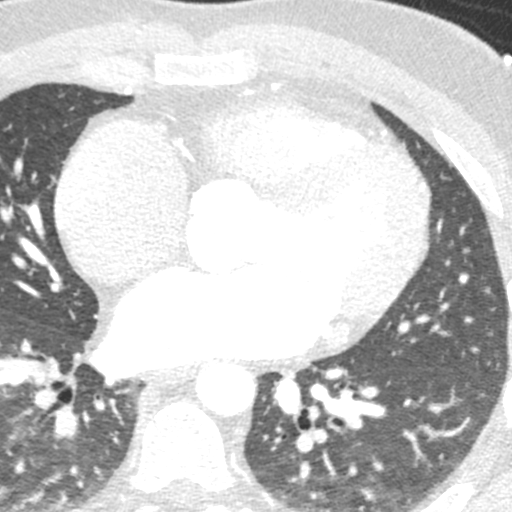
[im 277/356  lung]
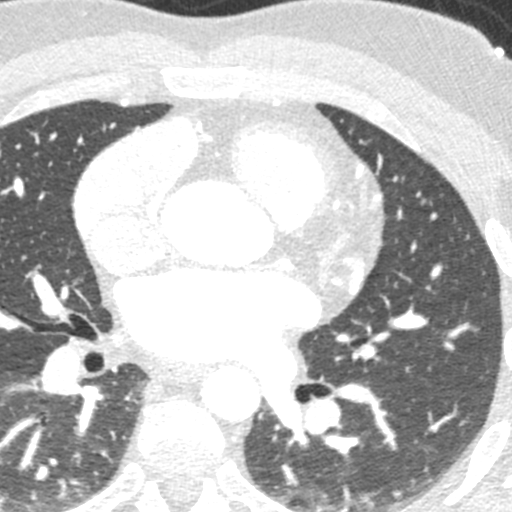
[im 316/356  lung]
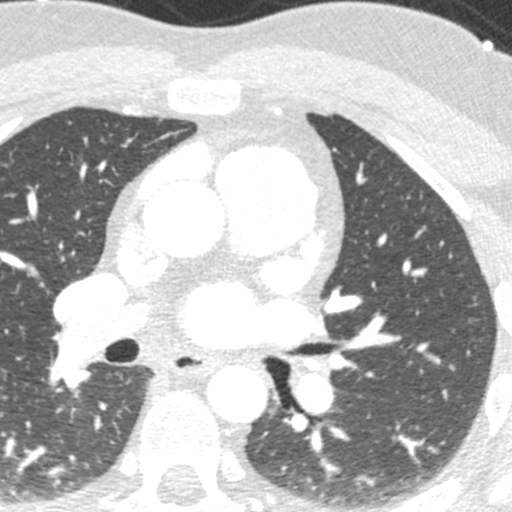

[16 of 20 positions shown; findings below may reference images not displayed]

FINDINGS: Vascular: Heart is normal size.  Visualized aorta normal caliber.

Mediastinum/Nodes: No adenopathy in the lower mediastinum or hila.

Lungs/Pleura: No confluent opacities or effusions.

Upper Abdomen: Imaging into the upper abdomen shows no acute
findings.

Musculoskeletal: Chest wall soft tissues are unremarkable. No acute
bony abnormality.
IMPRESSION: No acute or significant extracardiac abnormality.
FINDINGS: A 120 kV prospective scan was triggered in the descending thoracic
aorta at 111 HU's. Axial non-contrast 3 mm slices were carried out
through the heart. The data set was analyzed on a dedicated work
station and scored using the Agatson method. Gantry rotation speed
was 250 msecs and collimation was .6 mm. No beta blockade and 0.8 mg
of sl NTG was given. The 3D data set was reconstructed in 5%
intervals of the 67-82 % of the R-R cycle. Diastolic phases were
analyzed on a dedicated work station using MPR, MIP and VRT modes.
The patient received 80 cc of contrast.

Aorta:  Normal size.  No calcifications.  No dissection.

Aortic Valve:  Trileaflet.  No calcifications.

Coronary Arteries:  Normal coronary origin.  Left dominance.

RCA is a small nondominant artery.  There is no plaque.

Left main is a large artery that gives rise to LAD and LCX arteries.

LAD is a large vessel that gives rise to medium size diagonal; there
is no plaque.

LCX is a dominant artery that gives rise to OM1 and OM2 branches and
then terminates with PL and PDA. There is no plaque.

Other findings:

Normal pulmonary vein drainage into the left atrium.

Normal let atrial appendage without a thrombus.

Normal size of the pulmonary artery.
IMPRESSION: 1. Coronary calcium score of 0. This was 0 percentile for age and
sex matched control.

2. Normal coronary origin with right dominance.

3. No evidence of CAD; [id number].

Alana Nakamura

*** End of Addendum ***
EXAM:
OVER-READ INTERPRETATION  CT CHEST

The following report is an over-read performed by radiologist Dr.
Dileep Nodarse [REDACTED] on 03/03/2019. This
over-read does not include interpretation of cardiac or coronary
anatomy or pathology. The coronary CTA interpretation by the
cardiologist is attached.
FINDINGS: Vascular: Heart is normal size.  Visualized aorta normal caliber.

Mediastinum/Nodes: No adenopathy in the lower mediastinum or hila.

Lungs/Pleura: No confluent opacities or effusions.

Upper Abdomen: Imaging into the upper abdomen shows no acute
findings.

Musculoskeletal: Chest wall soft tissues are unremarkable. No acute
bony abnormality.
IMPRESSION: No acute or significant extracardiac abnormality.

## 2021-10-03 DIAGNOSIS — E785 Hyperlipidemia, unspecified: Secondary | ICD-10-CM | POA: Diagnosis not present

## 2021-10-03 DIAGNOSIS — I10 Essential (primary) hypertension: Secondary | ICD-10-CM | POA: Diagnosis not present

## 2021-10-03 DIAGNOSIS — R739 Hyperglycemia, unspecified: Secondary | ICD-10-CM | POA: Diagnosis not present

## 2021-10-03 DIAGNOSIS — I4729 Other ventricular tachycardia: Secondary | ICD-10-CM | POA: Diagnosis not present

## 2022-01-03 DIAGNOSIS — I10 Essential (primary) hypertension: Secondary | ICD-10-CM | POA: Diagnosis not present

## 2022-01-03 DIAGNOSIS — R739 Hyperglycemia, unspecified: Secondary | ICD-10-CM | POA: Diagnosis not present

## 2022-01-03 DIAGNOSIS — E785 Hyperlipidemia, unspecified: Secondary | ICD-10-CM | POA: Diagnosis not present

## 2022-01-03 DIAGNOSIS — I4729 Other ventricular tachycardia: Secondary | ICD-10-CM | POA: Diagnosis not present

## 2022-03-24 ENCOUNTER — Other Ambulatory Visit: Payer: Self-pay | Admitting: Cardiology

## 2022-03-31 ENCOUNTER — Other Ambulatory Visit: Payer: Self-pay | Admitting: Internal Medicine

## 2022-06-09 ENCOUNTER — Other Ambulatory Visit: Payer: Self-pay | Admitting: Internal Medicine

## 2022-07-07 DIAGNOSIS — E785 Hyperlipidemia, unspecified: Secondary | ICD-10-CM | POA: Diagnosis not present

## 2022-07-07 DIAGNOSIS — I10 Essential (primary) hypertension: Secondary | ICD-10-CM | POA: Diagnosis not present

## 2022-07-07 DIAGNOSIS — R739 Hyperglycemia, unspecified: Secondary | ICD-10-CM | POA: Diagnosis not present

## 2022-07-07 DIAGNOSIS — I4729 Other ventricular tachycardia: Secondary | ICD-10-CM | POA: Diagnosis not present

## 2022-07-10 DIAGNOSIS — M25512 Pain in left shoulder: Secondary | ICD-10-CM | POA: Diagnosis not present

## 2022-07-11 ENCOUNTER — Ambulatory Visit: Payer: BC Managed Care – PPO | Admitting: Student

## 2022-07-14 ENCOUNTER — Other Ambulatory Visit: Payer: Self-pay | Admitting: Internal Medicine

## 2022-07-16 DIAGNOSIS — M25512 Pain in left shoulder: Secondary | ICD-10-CM | POA: Diagnosis not present

## 2022-07-16 DIAGNOSIS — M7542 Impingement syndrome of left shoulder: Secondary | ICD-10-CM | POA: Diagnosis not present

## 2022-07-18 ENCOUNTER — Other Ambulatory Visit: Payer: Self-pay | Admitting: Pharmacist

## 2022-07-18 MED ORDER — FLECAINIDE ACETATE 100 MG PO TABS: 100.0000 mg | ORAL_TABLET | Freq: Two times a day (BID) | ORAL | 0 refills | Status: DC

## 2022-07-18 NOTE — Progress Notes (Signed)
Pt called PharmD line for medication refill. Not sure who transferred him to this # as he has never followed with PharmD and we do not address med refills. He stated that [facility] canceled out his 1 month flecainide rx because it wasn't a 3 month rx and refused to fill it? Only had 1 month sent in bc he was overdue for follow up. Has appt scheduled in a few weeks. Asked that flecainide refill be sent to his [facility] for 1 month supply which I have done.

## 2022-07-23 DIAGNOSIS — M25512 Pain in left shoulder: Secondary | ICD-10-CM | POA: Diagnosis not present

## 2022-07-23 DIAGNOSIS — M7542 Impingement syndrome of left shoulder: Secondary | ICD-10-CM | POA: Diagnosis not present

## 2022-07-29 NOTE — Progress Notes (Unsigned)
Cardiology Office Note Date:  07/31/2022  Patient ID:  Anthony, Rodgers [birth year], MRN [id number] PCP:  Hurshel Party, NP  Cardiologist:  Dr. Servando Salina Electrophysiologist: Dr. Ladona Ridgel    Chief Complaint:  annual visit  History of Present Illness: Anthony Rodgers is a 42 y.o. male with history of HTN, PVCs, NSVT, obesity  He saw Dr. Servando Salina 08/09/20, feeling better on flecainide/BB, no changes made, back to work, got a promotion. Her note mentions AFib, though I do not find AFib anywhere else, or formally discussed  He saw Dr. Ladona Ridgel 06/14/21, rare palpitations, no changes made, planned to see hiom back in a year and consider wean off his AAD tx  TODAY He is doing well Following mostly a KETO style diet and lost about 20lbs in the last 6 mo, and can really tell the difference, feels well He has not really gotten to regular exercise but is on his list of things to do! 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 Please keep scheduled appointment for future refills. Thank you. 60  tablet 0   meloxicam (MOBIC) 15 MG tablet Take 15 mg by mouth as needed.     metoprolol succinate (TOPROL-XL) 25 MG 24 hr tablet Take 1 tablet (25 mg total) by mouth daily. 90 tablet 3   No current facility-administered medications for this visit.    Allergies:   Patient has no known allergies.   Social History:  The patient  reports that he has never smoked. He has never used smokeless tobacco. He reports that he does not currently use alcohol. He reports that he does not use drugs.   Family History:  The patient's family history includes Arrhythmia in his brother and father; Diabetes in his mother; Heart attack in his father and paternal grandmother; Heart disease in his father and paternal grandmother; Heart failure in his father; Hyperlipidemia in his father and mother; Hypertension in his father and mother.  ROS:  Please see the history of present illness.    All other systems are reviewed and otherwise negative.   PHYSICAL EXAM:  VS:  BP 114/76   Pulse 66   Ht 5\' 9"  (1.753 m)   Wt 207 lb (93.9 kg)   SpO2 97%   BMI 30.57 kg/m  BMI: Body mass index is 30.57 kg/m. Well nourished, well developed, in no acute distress HEENT: normocephalic, atraumatic Neck: no JVD, carotid bruits or masses Cardiac:  RRR; no significant murmurs, no rubs, or gallops Lungs:  CTA b/l, no wheezing, rhonchi or rales Abd: soft, nontender MS: no deformity or atrophy Ext:  no edema Skin: warm and dry, no rash Neuro:  No gross deficits appreciated Psych: euthymic mood, full affect   EKG:  Done today and reviewed by myself shows  SR 66bpm, PR , QRS 98ms, Qtc   07/12/19: (appears an incomplete report) ETT Stress Findings The patient exercised following the Bruce protocol.   The patient reported no symptoms during the stress test. The patient experienced no angina during the stress test.   Test was stopped per protocol.    Blood pressure and heart rate demonstrated a normal response to  exercise. Blood pressure demonstrated a normal response to exercise. Overall, the patient's exercise capacity was excellent.   85% of maximum heart rate was achieved after 10.1 minutes.  Recovery time:  5 minutes.  The patient's response to exercise was adequate for diagnosis.   CCTA IMPRESSION: 03/03/2019 1. Coronary calcium score of 0. This was 0 percentile for age and sex matched control.   2. Normal coronary origin with right dominance.   3. No evidence of CAD; CADRADS-0.   Zio monitor  The patient wore the monitor for 7 days 7 hours starting 02/17/2019. Indication: Palpitations   The minimum heart rate was 47 bpm, maximum heart rate was 179 bpm, and average heart rate was 73  bpm. Predominant underlying rhythm was Sinus Rhythm.   1 run of Ventricular Tachycardia occurred lasting 8 beats with a maximum rate of 179 bpm (average 157 bpm).    Premature atrial complexes were rare (<1.0%).  Premature Ventricular complexes were rare (<1.0%). Ventricular Bigeminy and Trigeminy were present.   No pauses, No AV block and no atrial fibrillation present.   1 patient triggered event associated with ventricular ectopy. Diary note was associated with sinus rhythm.   Conclusion: This study is remarkable for an 8 beat run of nonsustained ventricular tachycardia.  02/18/2019: TTE 1. Left ventricular ejection fraction, by visual estimation, is 60 to  65%. The left ventricle has normal function. There is no left ventricular  hypertrophy.   2. The left ventricle has no regional wall motion abnormalities.   3. Global right ventricle has normal systolic function.The right  ventricular size is normal. No increase in right ventricular wall  thickness.   4. Left atrial size was normal.   5. Right atrial size was normal.   6. The mitral valve is normal in structure. No evidence of mitral valve  regurgitation. No evidence of mitral stenosis.   7. The tricuspid valve is normal in structure.  Tricuspid valve  regurgitation is not demonstrated.   8. The aortic valve is normal in structure. Aortic valve regurgitation is  not visualized. No evidence of aortic valve sclerosis or stenosis.   9. The pulmonic valve was normal in structure. Pulmonic valve  regurgitation is not visualized.  10. Normal pulmonary artery systolic pressure.   Recent Labs: No results found for requested labs within last 365 days.  No results found for requested labs within last 365 days.   CrCl cannot be calculated (Patient's most recent lab result is older than the maximum 21 days allowed.).   Wt Readings from Last 3 Encounters:  07/31/22 207 lb (93.9 kg)  06/14/21 212 lb 12.8 oz (96.5 kg)  08/09/20 208 lb 12.8 oz (94.7 kg)     Other studies reviewed: Additional studies/records reviewed today include: summarized above  ASSESSMENT AND PLAN:  NSVT PVCs Flecainide w/metoprolol stable intervals Low/no burden of arrhythmia  Discussed perhaps lower dose/weaning off/stopping AD/meds, though he is really feeling  well, and will make no changes at this time.  3. HTN Looks good  Disposition: F/u with Dr. Servando Salina as scheduled, EP again in a year, sooner if needed  Current medicines are reviewed at length with the patient today.  The patient did not have any concerns regarding medicines.  Norma Fredrickson, PA-C 07/31/2022 10:40 AM     [facility] [address] [place] [address] [phone] (office)  [phone] (fax)

## 2022-07-31 ENCOUNTER — Ambulatory Visit: Payer: BC Managed Care – PPO | Attending: Student | Admitting: Physician Assistant

## 2022-07-31 ENCOUNTER — Encounter: Payer: Self-pay | Admitting: Physician Assistant

## 2022-07-31 VITALS — BP 114/76 | HR 66 | Ht 69.0 in | Wt 207.0 lb

## 2022-07-31 DIAGNOSIS — I4729 Other ventricular tachycardia: Secondary | ICD-10-CM | POA: Diagnosis not present

## 2022-07-31 DIAGNOSIS — Z79899 Other long term (current) drug therapy: Secondary | ICD-10-CM | POA: Diagnosis not present

## 2022-07-31 DIAGNOSIS — I493 Ventricular premature depolarization: Secondary | ICD-10-CM

## 2022-07-31 DIAGNOSIS — Z5181 Encounter for therapeutic drug level monitoring: Secondary | ICD-10-CM | POA: Diagnosis not present

## 2022-07-31 NOTE — Patient Instructions (Addendum)
Medication Instructions:   Your physician recommends that you continue on your current medications as directed. Please refer to the Current Medication list given to you today.  *If you need a refill on your cardiac medications before your next appointment, please call your pharmacy*   Lab Work: NONE ORDERED  TODAY   If you have labs (blood work) drawn today and your tests are completely normal, you will receive your results only by: MyChart Message (if you have MyChart) OR A paper copy in the mail If you have any lab test that is abnormal or we need to change your treatment, we will call you to review the results.   Testing/Procedures: NONE ORDERED  TODAY     Follow-Up: At [facility], you and your health needs are our priority.  As part of our continuing mission to provide you with exceptional heart care, we have created designated Provider Care Teams.  These Care Teams include your primary Cardiologist (physician) and Advanced Practice Providers (APPs -  Physician Assistants and Nurse Practitioners) who all work together to provide you with the care you need, when you need it.  We recommend signing up for the patient portal called "MyChart".  Sign up information is provided on this After Visit Summary.  MyChart is used to connect with patients for Virtual Visits (Telemedicine).  Patients are able to view lab/test results, encounter notes, upcoming appointments, etc.  Non-urgent messages can be sent to your provider as well.   To learn more about what you can do with MyChart, go to [email or web address].    Your next appointment:   1 year(s)  Provider:   You may see Dr Taylor or one of the following Advanced Practice Providers on your designated Care Team:   Other Instructions  

## 2022-08-01 DIAGNOSIS — M25512 Pain in left shoulder: Secondary | ICD-10-CM | POA: Diagnosis not present

## 2022-08-01 DIAGNOSIS — M7542 Impingement syndrome of left shoulder: Secondary | ICD-10-CM | POA: Diagnosis not present

## 2022-08-08 DIAGNOSIS — M7542 Impingement syndrome of left shoulder: Secondary | ICD-10-CM | POA: Diagnosis not present

## 2022-08-08 DIAGNOSIS — M25512 Pain in left shoulder: Secondary | ICD-10-CM | POA: Diagnosis not present

## 2022-09-01 ENCOUNTER — Other Ambulatory Visit: Payer: Self-pay

## 2022-09-01 MED ORDER — FLECAINIDE ACETATE 100 MG PO TABS: 100.0000 mg | ORAL_TABLET | Freq: Two times a day (BID) | ORAL | 3 refills | Status: DC

## 2022-10-23 ENCOUNTER — Encounter: Payer: Self-pay | Admitting: Cardiology

## 2022-10-23 ENCOUNTER — Ambulatory Visit: Payer: BC Managed Care – PPO | Attending: Cardiology | Admitting: Cardiology

## 2022-10-23 VITALS — BP 116/78 | HR 62 | Ht 69.0 in | Wt 206.0 lb

## 2022-10-23 DIAGNOSIS — I4729 Other ventricular tachycardia: Secondary | ICD-10-CM | POA: Diagnosis not present

## 2022-10-23 DIAGNOSIS — I493 Ventricular premature depolarization: Secondary | ICD-10-CM

## 2022-10-23 DIAGNOSIS — Z79899 Other long term (current) drug therapy: Secondary | ICD-10-CM

## 2022-10-23 NOTE — Patient Instructions (Signed)
Medication Instructions:   Your physician recommends that you continue on your current medications as directed. Please refer to the Current Medication list given to you today.   *If you need a refill on your cardiac medications before your next appointment, please call your pharmacy*   Lab Work:  TODAY!!! FLECAINIDE   If you have labs (blood work) drawn today and your tests are completely normal, you will receive your results only by: MyChart Message (if you have MyChart) OR A paper copy in the mail If you have any lab test that is abnormal or we need to change your treatment, we will call you to review the results.   Testing/Procedures:  None ordered.   Follow-Up: At [facility], you and your health needs are our priority.  As part of our continuing mission to provide you with exceptional heart care, we have created designated Provider Care Teams.  These Care Teams include your primary Cardiologist (physician) and Advanced Practice Providers (APPs -  Physician Assistants and Nurse Practitioners) who all work together to provide you with the care you need, when you need it.  We recommend signing up for the patient portal called "MyChart".  Sign up information is provided on this After Visit Summary.  MyChart is used to connect with patients for Virtual Visits (Telemedicine).  Patients are able to view lab/test results, encounter notes, upcoming appointments, etc.  Non-urgent messages can be sent to your provider as well.   To learn more about what you can do with MyChart, go to [email or web address].    Your next appointment:   2 year(s)  Provider:   Thomasene Ripple, DO     Other Instructions  Your physician wants you to follow-up in: 2 year with Dr. Servando Salina.  You will receive a reminder letter in the mail two months in advance. If you don't receive a letter, please call our office to schedule the follow-up appointment.

## 2022-10-26 NOTE — Progress Notes (Signed)
Cardiology Office Note:    Date:  10/26/2022   ID:  [name], DOB January 09, 1981, MRN [id number]  PCP:  Hurshel Party, NP  Cardiologist:  Thomasene Ripple, DO  Electrophysiologist:  None   Referring MD: Hurshel Party, NP   " I am doing well"   History of Present Illness:    Anthony Rodgers is a 42 y.o. male with a hx of hypertension, PVCs, NSVT, obesity.  Here today for follow-up visit.  Has been about 2 years since I last saw the patient but he has been following closely with our EP partners.  He remains on flecainide and offers no complaints at this time.  Since I saw the patient he has had some diet changes and has lost tremendous amount of weight.  Past Medical History:  Diagnosis Date   Chest pain 02/17/2019   Encounter for monitoring flecainide therapy 05/18/2019   Family history of cardiomyopathy 02/21/2015   NSVT (nonsustained ventricular tachycardia) (HCC) 05/18/2019   Palpitations 02/17/2019   Pre-procedure lab exam 02/17/2019   PVC (premature ventricular contraction) 02/17/2019   Shortness of breath 02/17/2019    Past Surgical History:  Procedure Laterality Date   HAND SURGERY Right 2001   Football injury    Current Medications: Current Meds  Medication Sig   atorvastatin (LIPITOR) 20 MG tablet TAKE 1 TABLET DAILY   flecainide (TAMBOCOR) 100 MG tablet Take 1 tablet (100 mg total) by mouth 2 (two) times daily.   meloxicam (MOBIC) 15 MG tablet Take 15 mg by mouth as needed.   metoprolol succinate (TOPROL-XL) 25 MG 24 hr tablet Take 1 tablet (25 mg total) by mouth daily.     Allergies:   Patient has no known allergies.   Social History   Socioeconomic History   Marital status: Married    Spouse name: Not on file   Number of children: Not on file   Years of education: Not on file   Highest education level: Not on file  Occupational History   Not on file  Tobacco Use   Smoking status: Never   Smokeless tobacco: Never  Vaping Use   Vaping status:  Never Used  Substance and Sexual Activity   Alcohol use: Not Currently   Drug use: Never   Sexual activity: Not on file  Other Topics Concern   Not on file  Social History Narrative   Not on file   Social Determinants of Health   Financial Resource Strain: Not on file  Food Insecurity: Not on file  Transportation Needs: Not on file  Physical Activity: Not on file  Stress: Not on file  Social Connections: Not on file     Family History: The patient's family history includes Arrhythmia in his brother and father; Diabetes in his mother; Heart attack in his father and paternal grandmother; Heart disease in his father and paternal grandmother; Heart failure in his father; Hyperlipidemia in his father and mother; Hypertension in his father and mother.  ROS:   Review of Systems  Constitution: Negative for decreased appetite, fever and weight gain.  HENT: Negative for congestion, ear discharge, hoarse voice and sore throat.   Eyes: Negative for discharge, redness, vision loss in right eye and visual halos.  Cardiovascular: Negative for chest pain, dyspnea on exertion, leg swelling, orthopnea and palpitations.  Respiratory: Negative for cough, hemoptysis, shortness of breath and snoring.   Endocrine: Negative for heat intolerance and polyphagia.  Hematologic/Lymphatic: Negative for bleeding problem. Does not bruise/bleed  easily.  Skin: Negative for flushing, nail changes, rash and suspicious lesions.  Musculoskeletal: Negative for arthritis, joint pain, muscle cramps, myalgias, neck pain and stiffness.  Gastrointestinal: Negative for abdominal pain, bowel incontinence, diarrhea and excessive appetite.  Genitourinary: Negative for decreased libido, genital sores and incomplete emptying.  Neurological: Negative for brief paralysis, focal weakness, headaches and loss of balance.  Psychiatric/Behavioral: Negative for altered mental status, depression and suicidal ideas.   Allergic/Immunologic: Negative for HIV exposure and persistent infections.    EKGs/Labs/Other Studies Reviewed:    The following studies were reviewed today:   EKG:  The ekg ordered today demonstrates   Recent Labs: No results found for requested labs within last 365 days.  Recent Lipid Panel No results found for: "CHOL", "TRIG", "HDL", "CHOLHDL", "VLDL", "LDLCALC", "LDLDIRECT"  Physical Exam:    VS:  BP 116/78 (BP Location: Right Arm, Patient Position: Sitting, Cuff Size: Normal)   Pulse 62   Ht 5\' 9"  (1.753 m)   Wt 206 lb (93.4 kg)   SpO2 96%   BMI 30.42 kg/m     Wt Readings from Last 3 Encounters:  10/23/22 206 lb (93.4 kg)  07/31/22 207 lb (93.9 kg)  06/14/21 212 lb 12.8 oz (96.5 kg)     GEN: Well nourished, well developed in no acute distress HEENT: Normal NECK: No JVD; No carotid bruits LYMPHATICS: No lymphadenopathy CARDIAC: S1S2 noted,RRR, no murmurs, rubs, gallops RESPIRATORY:  Clear to auscultation without rales, wheezing or rhonchi  ABDOMEN: Soft, non-tender, non-distended, +bowel sounds, no guarding. EXTREMITIES: No edema, No cyanosis, no clubbing MUSCULOSKELETAL:  No deformity  SKIN: Warm and dry NEUROLOGIC:  Alert and oriented x 3, non-focal PSYCHIATRIC:  Normal affect, good insight  ASSESSMENT:    1. Medication management   2. PVC (premature ventricular contraction)   3. NSVT (nonsustained ventricular tachycardia) (HCC)   4. Morbid obesity (HCC)    PLAN:    He is doing well from a CV standpoint, no medication change.  Will get flecainide level.  The patient understands the need to lose weight with diet and exercise. We have discussed specific strategies for this.   The patient is in agreement with the above plan. The patient left the office in stable condition.  The patient will follow up in   Medication Adjustments/Labs and Tests Ordered: Current medicines are reviewed at length with the patient today.  Concerns regarding medicines are  outlined above.  Orders Placed This Encounter  Procedures   Flecainide level   No orders of the defined types were placed in this encounter.   Patient Instructions  Medication Instructions:   Your physician recommends that you continue on your current medications as directed. Please refer to the Current Medication list given to you today.   *If you need a refill on your cardiac medications before your next appointment, please call your pharmacy*   Lab Work:  TODAY!!! FLECAINIDE   If you have labs (blood work) drawn today and your tests are completely normal, you will receive your results only by: MyChart Message (if you have MyChart) OR A paper copy in the mail If you have any lab test that is abnormal or we need to change your treatment, we will call you to review the results.   Testing/Procedures:  None ordered.   Follow-Up: At [facility], you and your health needs are our priority.  As part of our continuing mission to provide you with exceptional heart care, we have created designated Provider Care Teams.  These Care Teams include your primary Cardiologist (physician) and Advanced Practice Providers (APPs -  Physician Assistants and Nurse Practitioners) who all work together to provide you with the care you need, when you need it.  We recommend signing up for the patient portal called "MyChart".  Sign up information is provided on this After Visit Summary.  MyChart is used to connect with patients for Virtual Visits (Telemedicine).  Patients are able to view lab/test results, encounter notes, upcoming appointments, etc.  Non-urgent messages can be sent to your provider as well.   To learn more about what you can do with MyChart, go to [email or web address].    Your next appointment:   2 year(s)  Provider:   Thomasene Ripple, DO     Other Instructions  Your physician wants you to follow-up in: 2 year with Dr. Servando Salina.  You will receive a reminder letter in the  mail two months in advance. If you don't receive a letter, please call our office to schedule the follow-up appointment.     Adopting a Healthy Lifestyle.  Know what a healthy weight is for you (roughly BMI <25) and aim to maintain this   Aim for 7+ servings of fruits and vegetables daily   65-80+ fluid ounces of water or unsweet tea for healthy kidneys   Limit to max 1 drink of alcohol per day; avoid smoking/tobacco   Limit animal fats in diet for cholesterol and heart health - choose grass fed whenever available   Avoid highly processed foods, and foods high in saturated/trans fats   Aim for low stress - take time to unwind and care for your mental health   Aim for 150 min of moderate intensity exercise weekly for heart health, and weights twice weekly for bone health   Aim for 7-9 hours of sleep daily   When it comes to diets, agreement about the perfect plan isnt easy to find, even among the experts. Experts at the [facility] [employer] developed an idea known as the Healthy Eating Plate. Just imagine a plate divided into logical, healthy portions.   The emphasis is on diet quality:   Load up on vegetables and fruits - one-half of your plate: Aim for color and variety, and remember that potatoes dont count.   Go for whole grains - one-quarter of your plate: Whole wheat, barley, wheat berries, quinoa, oats, brown rice, and foods made with them. If you want pasta, go with whole wheat pasta.   Protein power - one-quarter of your plate: Fish, chicken, beans, and nuts are all healthy, versatile protein sources. Limit red meat.   The diet, however, does go beyond the plate, offering a few other suggestions.   Use healthy plant oils, such as olive, canola, soy, corn, sunflower and peanut. Check the labels, and avoid partially hydrogenated oil, which have unhealthy trans fats.   If youre thirsty, drink water. Coffee and tea are good in moderation, but skip sugary drinks  and limit milk and dairy products to one or two daily servings.   The type of carbohydrate in the diet is more important than the amount. Some sources of carbohydrates, such as vegetables, fruits, whole grains, and beans-are healthier than others.   Finally, stay active  Signed, Thomasene Ripple, DO  10/26/2022 8:10 PM    [facility]

## 2022-11-04 LAB — FLECAINIDE LEVEL: Flecainide: 0.38 ug/mL (ref 0.20–1.00)

## 2023-01-09 DIAGNOSIS — Z0000 Encounter for general adult medical examination without abnormal findings: Secondary | ICD-10-CM | POA: Diagnosis not present

## 2023-01-09 DIAGNOSIS — Z6832 Body mass index (BMI) 32.0-32.9, adult: Secondary | ICD-10-CM | POA: Diagnosis not present

## 2023-03-18 ENCOUNTER — Other Ambulatory Visit: Payer: Self-pay | Admitting: Cardiology

## 2023-03-26 ENCOUNTER — Other Ambulatory Visit: Payer: Self-pay | Admitting: Internal Medicine

## 2023-07-13 DIAGNOSIS — E785 Hyperlipidemia, unspecified: Secondary | ICD-10-CM | POA: Diagnosis not present

## 2023-07-13 DIAGNOSIS — I4729 Other ventricular tachycardia: Secondary | ICD-10-CM | POA: Diagnosis not present

## 2023-07-13 DIAGNOSIS — I10 Essential (primary) hypertension: Secondary | ICD-10-CM | POA: Diagnosis not present

## 2023-07-13 DIAGNOSIS — R739 Hyperglycemia, unspecified: Secondary | ICD-10-CM | POA: Diagnosis not present

## 2023-08-12 ENCOUNTER — Other Ambulatory Visit: Payer: Self-pay | Admitting: Physician Assistant

## 2023-08-19 NOTE — Progress Notes (Signed)
 Cardiology Office Note Date:  08/19/2023  Patient ID:  Anthony Rodgers 1981-01-01, MRN [id number] PCP:  Olga Berthold, NP  Cardiologist:  Dr. Emmette Harms Electrophysiologist: Dr. Carolynne Citron    Chief Complaint:  annual visit  History of Present Illness: Anthony Rodgers is a 43 y.o. male with history of HTN, PVCs, NSVT, obesity  He saw Dr. Emmette Harms 08/09/20, feeling better on flecainide /BB, no changes made, back to work, got a promotion. Her note mentions AFib, though I do not find AFib anywhere else, or formally discussed  He saw Dr. Carolynne Citron 06/14/21, rare palpitations, no changes made, planned to see hiom back in a year and consider wean off his AAD tx  I saw him 07/31/22 He is doing well Following mostly a KETO style diet and lost about 20lbs in the last 6 mo, and can really tell the difference, feels well He has not really gotten to regular exercise but is on his list of things to do! Denies any exertional intolerances Rare sharp central/L CP that is fleeting His palpitations remain markedly improved, prior to Flecainide  were frequent and very symptomatic No near syncope or syncope Discussed perhaps lower dose/weaning off/stopping AAD/meds, though he was really feeling well, and will make no changes at this time.   He saw Dr. Emmette Harms 10/23/22, feeling very well, planned for a flecainide  level  TODAY  He is doing well Put some weight back on, work is a Health and safety inspector job and not exercising as much as he had been but getting back on track. Notes sometimes at the end of the day he has a bot of swelling around his ankles, none otherwise   No SOB, DOE No CP, palpitations or cardiac awareness No near syncope or syncope.  He was very aware of his PVCs in the past, they remain very well controlled  AAD hx Flecainide  started Feb 2021   Past Medical History:  Diagnosis Date   Chest pain 02/17/2019   Encounter for monitoring flecainide  therapy 05/18/2019   Family history of cardiomyopathy  02/21/2015   NSVT (nonsustained ventricular tachycardia) (HCC) 05/18/2019   Palpitations 02/17/2019   Pre-procedure lab exam 02/17/2019   PVC (premature ventricular contraction) 02/17/2019   Shortness of breath 02/17/2019    Past Surgical History:  Procedure Laterality Date   HAND SURGERY Right 2001   Football injury    Current Outpatient Medications  Medication Sig Dispense Refill   atorvastatin  (LIPITOR) 20 MG tablet TAKE 1 TABLET DAILY 90 tablet 3   flecainide  (TAMBOCOR ) 100 MG tablet TAKE 1 TABLET TWICE A DAY 180 tablet 3   meloxicam (MOBIC) 15 MG tablet Take 15 mg by mouth as needed.     metoprolol  succinate (TOPROL -XL) 25 MG 24 hr tablet TAKE 1 TABLET DAILY 90 tablet 2   No current facility-administered medications for this visit.    Allergies:   Patient has no known allergies.   Social History:  The patient  reports that he has never smoked. He has never used smokeless tobacco. He reports that he does not currently use alcohol. He reports that he does not use drugs.   Family History:  The patient's family history includes Arrhythmia in his brother and father; Diabetes in his mother; Heart attack in his father and paternal grandmother; Heart disease in his father and paternal grandmother; Heart failure in his father; Hyperlipidemia in his father and mother; Hypertension in his father and mother.  ROS:  Please see the history of present illness.  All other systems are reviewed and otherwise negative.   PHYSICAL EXAM:  VS:  There were no vitals taken for this visit. BMI: There is no height or weight on file to calculate BMI. Well nourished, well developed, in no acute distress HEENT: normocephalic, atraumatic Neck: no JVD, carotid bruits or masses Cardiac: RRR; no significant murmurs, no rubs, or gallops Lungs: CTA b/l, no wheezing, rhonchi or rales Abd: soft, nontender MS: no deformity or atrophy Ext: no edema Skin: warm and dry, no rash Neuro:  No gross deficits  appreciated Psych: euthymic mood, full affect   EKG:  Done today and reviewed by myself shows  SR 66bpm, PR , QRS , QTc T changes seen on prior EKGs)  07/31/22: SR 66bpm, PR , QRS 98ms, Qtc   07/12/19: (appears an incomplete report) ETT Stress Findings The patient exercised following the Bruce protocol.   The patient reported no symptoms during the stress test. The patient experienced no angina during the stress test.   Test was stopped per protocol.    Blood pressure and heart rate demonstrated a normal response to exercise. Blood pressure demonstrated a normal response to exercise. Overall, the patient's exercise capacity was excellent.   85% of maximum heart rate was achieved after 10.1 minutes.  Recovery time:  5 minutes.  The patient's response to exercise was adequate for diagnosis.   CCTA IMPRESSION: 03/03/2019 1. Coronary calcium  score of 0. This was 0 percentile for age and sex matched control.   2. Normal coronary origin with right dominance.   3. No evidence of CAD; CADRADS-0.   Zio monitor  The patient wore the monitor for 7 days 7 hours starting 02/17/2019. Indication: Palpitations   The minimum heart rate was 47 bpm, maximum heart rate was 179 bpm, and average heart rate was 73  bpm. Predominant underlying rhythm was Sinus Rhythm.   1 run of Ventricular Tachycardia occurred lasting 8 beats with a maximum rate of 179 bpm (average 157 bpm).    Premature atrial complexes were rare (<1.0%).  Premature Ventricular complexes were rare (<1.0%). Ventricular Bigeminy and Trigeminy were present.   No pauses, No AV block and no atrial fibrillation present.   1 patient triggered event associated with ventricular ectopy. Diary note was associated with sinus rhythm.   Conclusion: This study is remarkable for an 8 beat run of nonsustained ventricular tachycardia.  02/18/2019: TTE 1. Left ventricular ejection fraction, by visual estimation, is 60  to  65%. The left ventricle has normal function. There is no left ventricular  hypertrophy.   2. The left ventricle has no regional wall motion abnormalities.   3. Global right ventricle has normal systolic function.The right  ventricular size is normal. No increase in right ventricular wall  thickness.   4. Left atrial size was normal.   5. Right atrial size was normal.   6. The mitral valve is normal in structure. No evidence of mitral valve  regurgitation. No evidence of mitral stenosis.   7. The tricuspid valve is normal in structure. Tricuspid valve  regurgitation is not demonstrated.   8. The aortic valve is normal in structure. Aortic valve regurgitation is  not visualized. No evidence of aortic valve sclerosis or stenosis.   9. The pulmonic valve was normal in structure. Pulmonic valve  regurgitation is not visualized.  10. Normal pulmonary artery systolic pressure.   Recent Labs: No results found for requested labs within last 365 days.  No results found for requested  labs within last 365 days.   CrCl cannot be calculated (Patient's most recent lab result is older than the maximum 21 days allowed.).   Wt Readings from Last 3 Encounters:  10/23/22 206 lb (93.4 kg)  07/31/22 207 lb (93.9 kg)  06/14/21 212 lb 12.8 oz (96.5 kg)     Other studies reviewed: Additional studies/records reviewed today include: summarized above  ASSESSMENT AND PLAN:  NSVT PVCs Flecainide  w/metoprolol  He has had slight increase in intervals, no change > see him back in 4 mo Low/no burden of arrhythmia/ectopy  Previously have discussed perhaps coming off AADs, had preferred to make no changes PVCs/NSVTs were quite symptomatic for him   3. HTN Looks good  Disposition: as above, sooner if needed  Current medicines are reviewed at length with the patient today.  The patient did not have any concerns regarding medicines.  [place], PA-C 08/19/2023 12:43 PM     [facility] [address] [place] [address] [phone] (office)  [phone] (fax)

## 2023-08-21 ENCOUNTER — Ambulatory Visit: Attending: Physician Assistant | Admitting: Physician Assistant

## 2023-08-21 VITALS — BP 104/68 | HR 66 | Ht 69.0 in | Wt 228.0 lb

## 2023-08-21 DIAGNOSIS — Z5181 Encounter for therapeutic drug level monitoring: Secondary | ICD-10-CM

## 2023-08-21 DIAGNOSIS — I4729 Other ventricular tachycardia: Secondary | ICD-10-CM

## 2023-08-21 DIAGNOSIS — I493 Ventricular premature depolarization: Secondary | ICD-10-CM

## 2023-08-21 DIAGNOSIS — I10 Essential (primary) hypertension: Secondary | ICD-10-CM | POA: Diagnosis not present

## 2023-08-21 DIAGNOSIS — Z79899 Other long term (current) drug therapy: Secondary | ICD-10-CM

## 2023-08-21 NOTE — Patient Instructions (Addendum)
 Medication Instructions:   Your physician recommends that you continue on your current medications as directed. Please refer to the Current Medication list given to you today.   *If you need a refill on your cardiac medications before your next appointment, please call your pharmacy*    Lab Work: NONE ORDERED  TODAY    If you have labs (blood work) drawn today and your tests are completely normal, you will receive your results only by: MyChart Message (if you have MyChart) OR A paper copy in the mail If you have any lab test that is abnormal or we need to change your treatment, we will call you to review the results.   Testing/Procedures: NONE ORDERED  TODAY     Follow-Up: At [facility], you and your health needs are our priority.  As part of our continuing mission to provide you with exceptional heart care, our providers are all part of one team.  This team includes your primary Cardiologist (physician) and Advanced Practice Providers or APPs (Physician Assistants and Nurse Practitioners) who all work together to provide you with the care you need, when you need it.   Your next appointment:    4 month(s)   Provider:    Mertha Abrahams, PA-C   We recommend signing up for the patient portal called MyChart.  Sign up information is provided on this After Visit Summary.  MyChart is used to connect with patients for Virtual Visits (Telemedicine).  Patients are able to view lab/test results, encounter notes, upcoming appointments, etc.  Non-urgent messages can be sent to your provider as well.   To learn more about what you can do with MyChart, go to [email or web address].   Other Instructions

## 2023-12-18 NOTE — Progress Notes (Signed)
 Cardiology Office Note Date:  12/21/2023  Patient ID:  Anthony Rodgers, Anthony Rodgers [birth year], MRN [id number] PCP:  Erick Greig LABOR, NP  Cardiologist:  Dr. Sheena Electrophysiologist: Dr. Waddell    Chief Complaint:  4 mo  History of Present Illness: Anthony Rodgers is a 43 y.o. male with history of HTN, PVCs, NSVT, obesity  He saw Dr. Sheena 08/09/20, feeling better on flecainide /BB, no changes made, back to work, got a promotion. Her note mentions AFib, though I do not find AFib anywhere else, or formally discussed  He saw Dr. Waddell 06/14/21, rare palpitations, no changes made, planned to see hiom back in a year and consider wean off his AAD tx  I saw him 07/31/22 He is doing well Following mostly a KETO style diet and lost about 20lbs in the last 6 mo, and can really tell the difference, feels well He has not really gotten to regular exercise but is on his list of things to do! Denies any exertional intolerances Rare sharp central/L CP that is fleeting His palpitations remain markedly improved, prior to Flecainide  were frequent and very symptomatic No near syncope or syncope Discussed perhaps lower dose/weaning off/stopping AAD/meds, though he was really feeling well, and will make no changes at this time.   He saw Dr. 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 Notes sometimes at the end of the day he has a bot of swelling around his ankles, none otherwise   No SOB, DOE No CP, palpitations or cardiac awareness No near syncope or syncope. He was very aware of his PVCs in the past, they remain very well controlled Discussed prior tals about coming off AAD, though he was quite symptomatic with his ectopy/NSVT and preferred to stay the course. His intervals slightly longer Planned for 55mo visit  TODAY  He is doing  very well No CP, palpitations or cardiac awareness No SOB No near syncope or syncope  AAD hx Flecainide  started Feb 2021   Past Medical History:  Diagnosis Date   Chest pain 02/17/2019   Encounter for monitoring flecainide  therapy 05/18/2019   Family history of cardiomyopathy 02/21/2015   NSVT (nonsustained ventricular tachycardia) (HCC) 05/18/2019   Palpitations 02/17/2019   Pre-procedure lab exam 02/17/2019   PVC (premature ventricular contraction) 02/17/2019   Shortness of breath 02/17/2019    Past Surgical History:  Procedure Laterality Date   HAND SURGERY Right 2001   Football injury    Current Outpatient Medications  Medication Sig Dispense Refill   atorvastatin  (LIPITOR) 20 MG tablet TAKE 1 TABLET DAILY 90 tablet 3   flecainide  (TAMBOCOR ) 100 MG tablet TAKE 1 TABLET TWICE A DAY 180 tablet 3   metoprolol  succinate (TOPROL -XL) 25 MG 24 hr tablet TAKE 1 TABLET DAILY 90 tablet 2   No current facility-administered medications for this visit.    Allergies:   Patient has no known allergies.   Social History:  The patient  reports that he has never smoked. He has never used smokeless tobacco. He reports that he does not currently use alcohol. He reports that he does not use drugs.   Family History:  The patient's family history includes Arrhythmia in his brother and father; Diabetes in his mother; Heart attack in his father and paternal grandmother; Heart disease  in his father and paternal grandmother; Heart failure in his father; Hyperlipidemia in his father and mother; Hypertension in his father and mother.  ROS:  Please see the history of present illness.    All other systems are reviewed and otherwise negative.   PHYSICAL EXAM:  VS:  BP 120/88 (BP Location: Right Arm, Patient Position: Sitting, Cuff Size: Large)   Pulse 65   Ht 5' 9 (1.753 m)   Wt 231 lb (104.8 kg)   SpO2 97%   BMI 34.11 kg/m  BMI: Body mass index is 34.11 kg/m. Well nourished, well developed,  in no acute distress HEENT: normocephalic, atraumatic Neck: no JVD, carotid bruits or masses Cardiac:  RRR; no significant murmurs, no rubs, or gallops Lungs: CTA b/l, no wheezing, rhonchi or rales Abd: soft, nontender MS: no deformity or atrophy Ext: no edema Skin: warm and dry, no rash Neuro:  No gross deficits appreciated Psych: euthymic mood, full affect   EKG:  Done today and reviewed by myself shows  SR 65bpm, PR , QRS , QTc 401 No PVCs  08/21/23: SR 66bpm, PR , QRS , QTc T changes seen on prior EKGs)  07/31/22: SR 66bpm, PR , QRS 98ms, Qtc   07/12/19: (appears an incomplete report) ETT Stress Findings The patient exercised following the Bruce protocol.   The patient reported no symptoms during the stress test. The patient experienced no angina during the stress test.   Test was stopped per protocol.    Blood pressure and heart rate demonstrated a normal response to exercise. Blood pressure demonstrated a normal response to exercise. Overall, the patient's exercise capacity was excellent.   85% of maximum heart rate was achieved after 10.1 minutes.  Recovery time:  5 minutes.  The patient's response to exercise was adequate for diagnosis.   CCTA IMPRESSION: 03/03/2019 1. Coronary calcium  score of 0. This was 0 percentile for age and sex matched control.   2. Normal coronary origin with right dominance.   3. No evidence of CAD; CADRADS-0.   Zio monitor  The patient wore the monitor for 7 days 7 hours starting 02/17/2019. Indication: Palpitations   The minimum heart rate was 47 bpm, maximum heart rate was 179 bpm, and average heart rate was 73  bpm. Predominant underlying rhythm was Sinus Rhythm.   1 run of Ventricular Tachycardia occurred lasting 8 beats with a maximum rate of 179 bpm (average 157 bpm).    Premature atrial complexes were rare (<1.0%).  Premature Ventricular complexes were rare (<1.0%). Ventricular Bigeminy and  Trigeminy were present.   No pauses, No AV block and no atrial fibrillation present.   1 patient triggered event associated with ventricular ectopy. Diary note was associated with sinus rhythm.   Conclusion: This study is remarkable for an 8 beat run of nonsustained ventricular tachycardia.  02/18/2019: TTE 1. Left ventricular ejection fraction, by visual estimation, is 60 to  65%. The left ventricle has normal function. There is no left ventricular  hypertrophy.   2. The left ventricle has no regional wall motion abnormalities.   3. Global right ventricle has normal systolic function.The right  ventricular size is normal. No increase in right ventricular wall  thickness.   4. Left atrial size was normal.   5. Right atrial size was normal.   6. The mitral valve is normal in structure. No evidence of mitral valve  regurgitation. No evidence of mitral stenosis.   7. The tricuspid valve is normal in structure.  Tricuspid valve  regurgitation is not demonstrated.   8. The aortic valve is normal in structure. Aortic valve regurgitation is  not visualized. No evidence of aortic valve sclerosis or stenosis.   9. The pulmonic valve was normal in structure. Pulmonic valve  regurgitation is not visualized.  10. Normal pulmonary artery systolic pressure.   Recent Labs: No results found for requested labs within last 365 days.  No results found for requested labs within last 365 days.   CrCl cannot be calculated (Patient's most recent lab result is older than the maximum 21 days allowed.).   Wt Readings from Last 3 Encounters:  12/21/23 231 lb (104.8 kg)  08/21/23 228 lb (103.4 kg)  10/23/22 206 lb (93.4 kg)     Other studies reviewed: Additional studies/records reviewed today include: summarized above  ASSESSMENT AND PLAN:  NSVT PVCs Flecainide  w/metoprolol  Stable intervals No PVCs on EKG or exam  Previously have discussed perhaps coming off AADs, had preferred to make no  changes PVCs/NSVTs were quite symptomatic for him  Discussed Dr. Adrian upcoming retirement. He will d/w his wife (who is more familiar with the EP MD's for any preference to tranistion to)   3. HTN Looks good  Disposition: he is due to see Dr. Sheena, back with EP in 56mo again, sooner if needed  Current medicines are reviewed at length with the patient today.  The patient did not have any concerns regarding medicines.  Bonney Charlies Arthur, PA-C 12/21/2023 7:59 AM     [facility] [address] [place] [address] [phone] (office)  [phone] (fax)

## 2023-12-21 ENCOUNTER — Other Ambulatory Visit: Payer: Self-pay | Admitting: Internal Medicine

## 2023-12-21 ENCOUNTER — Ambulatory Visit: Attending: Physician Assistant | Admitting: Physician Assistant

## 2023-12-21 VITALS — BP 120/88 | HR 65 | Ht 69.0 in | Wt 231.0 lb

## 2023-12-21 DIAGNOSIS — I493 Ventricular premature depolarization: Secondary | ICD-10-CM

## 2023-12-21 DIAGNOSIS — I4729 Other ventricular tachycardia: Secondary | ICD-10-CM

## 2023-12-21 DIAGNOSIS — Z5181 Encounter for therapeutic drug level monitoring: Secondary | ICD-10-CM

## 2023-12-21 DIAGNOSIS — Z79899 Other long term (current) drug therapy: Secondary | ICD-10-CM

## 2023-12-21 DIAGNOSIS — I10 Essential (primary) hypertension: Secondary | ICD-10-CM | POA: Diagnosis not present

## 2023-12-21 NOTE — Patient Instructions (Addendum)
 Medication Instructions:   Your physician recommends that you continue on your current medications as directed. Please refer to the Current Medication list given to you today.  *If you need a refill on your cardiac medications before your next appointment, please call your pharmacy*  Lab Work: NONE ORDERED  TODAY   If you have labs (blood work) drawn today and your tests are completely normal, you will receive your results only by: MyChart Message (if you have MyChart) OR A paper copy in the mail If you have any lab test that is abnormal or we need to change your treatment, we will call you to review the results.  Testing/Procedures: NONE ORDERED  TODAY   Follow-Up: At [facility], you and your health needs are our priority.  As part of our continuing mission to provide you with exceptional heart care, our providers are all part of one team.  This team includes your primary Cardiologist (physician) and Advanced Practice Providers or APPs (Physician Assistants and Nurse Practitioners) who all work together to provide you with the care you need, when you need it.  Your next appointment:  NEXT AVAILABLE  WITH DR TOBB( OVERDUE)    4 month(s)   Provider:   Charlies Arthur, PA-C  ( CONTACT  CASSIE HALL/ ANGELINE HAMMER FOR EP SCHEDULING ISSUES )    We recommend signing up for the patient portal called MyChart.  Sign up information is provided on this After Visit Summary.  MyChart is used to connect with patients for Virtual Visits (Telemedicine).  Patients are able to view lab/test results, encounter notes, upcoming appointments, etc.  Non-urgent messages can be sent to your provider as well.   To learn more about what you can do with MyChart, go to [email or web address].   Other Instructions

## 2024-03-14 ENCOUNTER — Other Ambulatory Visit: Payer: Self-pay | Admitting: Cardiology

## 2024-04-15 NOTE — Progress Notes (Unsigned)
 "    Cardiology Office Note Date:  04/15/2024  Patient ID:  Anthony Rodgers, Anthony Rodgers [birth year], MRN [id number] PCP:  Erick Greig LABOR, NP  Cardiologist:  Dr. Sheena Electrophysiologist: Dr. Waddell (iniactive)    Chief Complaint:  *** 4 mo/flecainide   History of Present Illness: Anthony Rodgers is a 44 y.o. male with history of  HTN, PVCs, NSVT, obesity  He saw Dr. Sheena 08/09/20, feeling better on flecainide /BB, no changes made, back to work, got a promotion. Her note mentions AFib, though I do not find AFib anywhere else, or formally discussed  He saw Dr. Waddell 06/14/21, rare palpitations, no changes made, planned to see hiom back in a year and consider wean off his AAD tx  He saw Dr. 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 Notes sometimes at the end of the day he has a bot of swelling around his ankles, none otherwise   No SOB, DOE No CP, palpitations or cardiac awareness No near syncope or syncope. He was very aware of his PVCs in the past, they remain very well controlled Discussed prior tals about coming off AAD, though he was quite symptomatic with his ectopy/NSVT and preferred to stay the course. His intervals slightly longer Planned for 47mo visit  I saw him 12/21/23 He is doing very well No CP, palpitations or cardiac awareness No SOB No near syncope or syncope Stable intervals No changes He was going to talk to his wife to pick new EP MD    TODAY  *** symptoms *** flecainide , EKG, nodal blocker *** new EP MD, wife?   AAD hx PVCs/NSVT Flecainide  started Feb 2021   Past Medical History:  Diagnosis Date   Chest pain 02/17/2019   Encounter for monitoring flecainide  therapy 05/18/2019   Family history of cardiomyopathy 02/21/2015   NSVT  (nonsustained ventricular tachycardia) (HCC) 05/18/2019   Palpitations 02/17/2019   Pre-procedure lab exam 02/17/2019   PVC (premature ventricular contraction) 02/17/2019   Shortness of breath 02/17/2019    Past Surgical History:  Procedure Laterality Date   HAND SURGERY Right 2001   Football injury    Current Outpatient Medications  Medication Sig Dispense Refill   atorvastatin  (LIPITOR) 20 MG tablet TAKE 1 TABLET DAILY 90 tablet 2   flecainide  (TAMBOCOR ) 100 MG tablet TAKE 1 TABLET TWICE A DAY 180 tablet 3   metoprolol  succinate (TOPROL -XL) 25 MG 24 hr tablet Take 1 tablet (25 mg total) by mouth daily. 90 tablet 3   No current facility-administered medications for this visit.    Allergies:   Patient has no known allergies.   Social History:  The patient  reports that he has never smoked. He has never used smokeless tobacco. He reports that he does not currently use alcohol. He reports that he does not use drugs.   Family History:  The patient's family history includes Arrhythmia in his brother and father; Diabetes in his mother; Heart attack in his father and paternal grandmother; Heart disease in his father and paternal grandmother; Heart failure in his father; Hyperlipidemia in his father and mother; Hypertension in his father and mother.  ROS:  Please see the history of present illness.    All other systems are reviewed and otherwise negative.  PHYSICAL EXAM:  VS:  There were no vitals taken for this visit. BMI: There is no height or weight on file to calculate BMI. Well nourished, well developed, in no acute distress HEENT: normocephalic, atraumatic Neck: no JVD, carotid bruits or masses Cardiac: *** RRR; no significant murmurs, no rubs, or gallops Lungs: *** CTA b/l, no wheezing, rhonchi or rales Abd: soft, nontender MS: no deformity or atrophy Ext: *** no edema Skin: warm and dry, no rash Neuro:  No gross deficits appreciated Psych: euthymic mood, full  affect   EKG:  Done today and reviewed by myself shows  ***  12/21/23: SR 65bpm, PR , QRS , QTc 401, No PVCs  08/21/23: SR 66bpm, PR , QRS , QTc T changes seen on prior EKGs)  07/31/22: SR 66bpm, PR , QRS 98ms, Qtc   07/12/19: (appears an incomplete report) ETT Stress Findings The patient exercised following the Bruce protocol.   The patient reported no symptoms during the stress test. The patient experienced no angina during the stress test.   Test was stopped per protocol.    Blood pressure and heart rate demonstrated a normal response to exercise. Blood pressure demonstrated a normal response to exercise. Overall, the patient's exercise capacity was excellent.   85% of maximum heart rate was achieved after 10.1 minutes.  Recovery time:  5 minutes.  The patient's response to exercise was adequate for diagnosis.   CCTA IMPRESSION: 03/03/2019 1. Coronary calcium  score of 0. This was 0 percentile for age and sex matched control.   2. Normal coronary origin with right dominance.   3. No evidence of CAD; CADRADS-0.   Zio monitor  The patient wore the monitor for 7 days 7 hours starting 02/17/2019. Indication: Palpitations   The minimum heart rate was 47 bpm, maximum heart rate was 179 bpm, and average heart rate was 73  bpm. Predominant underlying rhythm was Sinus Rhythm.   1 run of Ventricular Tachycardia occurred lasting 8 beats with a maximum rate of 179 bpm (average 157 bpm).    Premature atrial complexes were rare (<1.0%).  Premature Ventricular complexes were rare (<1.0%). Ventricular Bigeminy and Trigeminy were present.   No pauses, No AV block and no atrial fibrillation present.   1 patient triggered event associated with ventricular ectopy. Diary note was associated with sinus rhythm.   Conclusion: This study is remarkable for an 8 beat run of nonsustained ventricular tachycardia.  02/18/2019: TTE 1. Left ventricular ejection  fraction, by visual estimation, is 60 to  65%. The left ventricle has normal function. There is no left ventricular  hypertrophy.   2. The left ventricle has no regional wall motion abnormalities.   3. Global right ventricle has normal systolic function.The right  ventricular size is normal. No increase in right ventricular wall  thickness.   4. Left atrial size was normal.   5. Right atrial size was normal.   6. The mitral valve is normal in structure. No evidence of mitral valve  regurgitation. No evidence of mitral stenosis.   7. The tricuspid valve is normal in structure. Tricuspid valve  regurgitation is not demonstrated.   8. The aortic valve is normal in structure. Aortic valve regurgitation is  not visualized. No evidence of aortic valve sclerosis or stenosis.   9. The pulmonic valve was normal in structure. Pulmonic valve  regurgitation is not visualized.  10. Normal pulmonary artery systolic pressure.   Recent Labs: No results found for requested labs within last  365 days.  No results found for requested labs within last 365 days.   CrCl cannot be calculated (Patient's most recent lab result is older than the maximum 21 days allowed.).   Wt Readings from Last 3 Encounters:  12/21/23 231 lb (104.8 kg)  08/21/23 228 lb (103.4 kg)  10/23/22 206 lb (93.4 kg)     Other studies reviewed: Additional studies/records reviewed today include: summarized above  ASSESSMENT AND PLAN:  NSVT PVCs Flecainide  w/*** metoprolol  *** Stable intervals *** No PVCs on EKG or exam  Previously have discussed perhaps coming off AADs, had preferred to make no changes PVCs/NSVTs were quite symptomatic for him   3. HTN *** Looks good  *** plan to transition to ***   Disposition: ***, sooner if needed  Current medicines are reviewed at length with the patient today.  The patient did not have any concerns regarding medicines.  Bonney Charlies Arthur, PA-C 04/15/2024 11:46 AM     [facility] [address] [place] [address] [phone] (office)  [phone] (fax)   "

## 2024-04-18 ENCOUNTER — Ambulatory Visit: Admitting: Physician Assistant
